# Patient Record
Sex: Female | Born: 1945
Health system: Southern US, Community
[De-identification: ages and names within clinical notes are randomized; demographics above are authoritative.]

## PROBLEM LIST (undated history)

## (undated) DIAGNOSIS — E785 Hyperlipidemia, unspecified: Secondary | ICD-10-CM

## (undated) DIAGNOSIS — C801 Malignant (primary) neoplasm, unspecified: Secondary | ICD-10-CM

## (undated) DIAGNOSIS — Z87442 Personal history of urinary calculi: Secondary | ICD-10-CM

## (undated) DIAGNOSIS — K219 Gastro-esophageal reflux disease without esophagitis: Secondary | ICD-10-CM

## (undated) HISTORY — PX: BASAL CELL CARCINOMA EXCISION: SHX1214

## (undated) HISTORY — DX: Gastro-esophageal reflux disease without esophagitis: K21.9

## (undated) HISTORY — DX: Hyperlipidemia, unspecified: E78.5

---

## 2003-11-04 ENCOUNTER — Other Ambulatory Visit: Admission: RE | Admit: 2003-11-04 | Discharge: 2003-11-04 | Payer: Self-pay | Admitting: Gynecology

## 2010-07-14 ENCOUNTER — Encounter: Payer: Self-pay | Admitting: Family Medicine

## 2010-07-14 ENCOUNTER — Ambulatory Visit
Admission: RE | Admit: 2010-07-14 | Discharge: 2010-07-14 | Payer: Self-pay | Source: Home / Self Care | Attending: Family Medicine | Admitting: Family Medicine

## 2010-07-14 DIAGNOSIS — K219 Gastro-esophageal reflux disease without esophagitis: Secondary | ICD-10-CM | POA: Insufficient documentation

## 2010-07-18 ENCOUNTER — Ambulatory Visit
Admission: RE | Admit: 2010-07-18 | Discharge: 2010-07-18 | Payer: Self-pay | Source: Home / Self Care | Attending: Family Medicine | Admitting: Family Medicine

## 2010-07-18 ENCOUNTER — Encounter: Payer: Self-pay | Admitting: Family Medicine

## 2010-07-18 ENCOUNTER — Other Ambulatory Visit: Payer: Self-pay | Admitting: Family Medicine

## 2010-07-18 LAB — CONVERTED CEMR LAB
Bilirubin Urine: NEGATIVE
Glucose, Urine, Semiquant: NEGATIVE
Ketones, urine, test strip: NEGATIVE
Nitrite: NEGATIVE
Protein, U semiquant: NEGATIVE
Specific Gravity, Urine: <1.005
Urobilinogen, UA: 0.2
WBC Urine, dipstick: NEGATIVE
pH: 7.5

## 2010-07-18 LAB — HEPATIC FUNCTION PANEL
ALT: 20 U/L (ref 0–35)
AST: 20 U/L (ref 0–37)
Albumin: 3.8 g/dL (ref 3.5–5.2)
Alkaline Phosphatase: 63 U/L (ref 39–117)
Bilirubin, Direct: 0.1 mg/dL (ref 0.0–0.3)
Total Bilirubin: 1.2 mg/dL (ref 0.3–1.2)
Total Protein: 6.5 g/dL (ref 6.0–8.3)

## 2010-07-18 LAB — CBC WITH DIFFERENTIAL/PLATELET
Basophils Absolute: 0 10*3/uL (ref 0.0–0.1)
Basophils Relative: 0.5 % (ref 0.0–3.0)
Eosinophils Absolute: 0.2 10*3/uL (ref 0.0–0.7)
Eosinophils Relative: 3.1 % (ref 0.0–5.0)
HCT: 38.6 % (ref 36.0–46.0)
Hemoglobin: 13.1 g/dL (ref 12.0–15.0)
Lymphocytes Relative: 26.2 % (ref 12.0–46.0)
Lymphs Abs: 1.5 10*3/uL (ref 0.7–4.0)
MCHC: 34 g/dL (ref 30.0–36.0)
MCV: 91.5 fl (ref 78.0–100.0)
Monocytes Absolute: 0.6 10*3/uL (ref 0.1–1.0)
Monocytes Relative: 10.2 % (ref 3.0–12.0)
Neutro Abs: 3.5 10*3/uL (ref 1.4–7.7)
Neutrophils Relative %: 60 % (ref 43.0–77.0)
Platelets: 208 10*3/uL (ref 150.0–400.0)
RBC: 4.22 Mil/uL (ref 3.87–5.11)
RDW: 13.1 % (ref 11.5–14.6)
WBC: 5.8 10*3/uL (ref 4.5–10.5)

## 2010-07-18 LAB — BASIC METABOLIC PANEL
BUN: 16 mg/dL (ref 6–23)
CO2: 33 mEq/L — ABNORMAL HIGH (ref 19–32)
Calcium: 9.4 mg/dL (ref 8.4–10.5)
Chloride: 106 mEq/L (ref 96–112)
Creatinine, Ser: 0.7 mg/dL (ref 0.4–1.2)
GFR: 86.59 mL/min (ref >60.00)
Glucose, Bld: 87 mg/dL (ref 70–99)
Potassium: 4.4 mEq/L (ref 3.5–5.1)
Sodium: 143 mEq/L (ref 135–145)

## 2010-07-18 LAB — LIPID PANEL
Cholesterol: 270 mg/dL — ABNORMAL HIGH (ref 0–200)
HDL: 60.9 mg/dL (ref >39.00)
Total CHOL/HDL Ratio: 4
Triglycerides: 93 mg/dL (ref 0.0–149.0)
VLDL: 18.6 mg/dL (ref 0.0–40.0)

## 2010-07-18 LAB — TSH: TSH: 2.74 u[IU]/mL (ref 0.35–5.50)

## 2010-07-18 LAB — LDL CHOLESTEROL, DIRECT: Direct LDL: 194.7 mg/dL

## 2010-07-19 ENCOUNTER — Encounter: Payer: Self-pay | Admitting: Gastroenterology

## 2010-08-07 ENCOUNTER — Encounter (INDEPENDENT_AMBULATORY_CARE_PROVIDER_SITE_OTHER): Payer: Self-pay | Admitting: *Deleted

## 2010-08-10 NOTE — Assessment & Plan Note (Addendum)
Summary: CPX//PH   Vital Signs:  Patient profile:   65 year old female Menstrual status:  postmenopausal Height:      61.5 inches Weight:      184.4 pounds BMI:     34.40 Pulse rate:   84 / minute Pulse rhythm:   regular BP sitting:   118 / 80  (left arm) Cuff size:   large  Vitals Entered By: Almeta Monas CMA Duncan Dull) (July 14, 2010 10:21 AM) CC: New ESt---cpx---not fasting     Menstrual Status postmenopausal   CC:  New ESt---cpx---not fasting.  History of Present Illness: Pt here to establish and have cpe...  Pt will see gyn for pap and breast exam.   Pt then c/o increasing gerd symptoms and the feeling that food gets stuck in her throat. She takes tums every day.  If she does not or eats something spicy or fatty she has symptoms.    Preventive Screening-Counseling & Management  Alcohol-Tobacco     Alcohol drinks/day: 0     Smoking Status: never  Caffeine-Diet-Exercise     Caffeine use/day: 2     Does Patient Exercise: no     Exercise Counseling: to improve exercise regimen  Hep-HIV-STD-Contraception     Dental Visit-last 6 months yes     Dental Care Counseling: not indicated; dental care within six months     SBE monthly: no     SBE Education/Counseling: to perform regular SBE      Sexual History:  currently monogamous.        Drug Use:  no.    Problems Prior to Update: 1)  Preventive Health Care  (ICD-V70.0) 2)  Genella Rife  (ICD-530.81)  Medications Prior to Update: 1)  None  Current Medications (verified): 1)  Multivitamins  Tabs (Multiple Vitamin) .... By Mouth Once Daily 2)  Coq10 30 Mg Caps (Coenzyme Q10) .... By Mouth Once Daily 3)  Probiotic  Caps (Probiotic Product) .... By Mouth Once Daily 4)  Omega 3 340 Mg Cpdr (Omega-3 Fatty Acids) .... By Mouth Once Daily 5)  Omeprazole 20 Mg Cpdr (Omeprazole) .Marland Kitchen.. 1 By Mouth Once Daily  Allergies (verified): No Known Drug Allergies  Past History:  Family History: Last updated: 07/14/2010 Family  History of Arthritis Family History High cholesterol Family History Ovarian cancer  Social History: Last updated: 07/14/2010 Retired Married Never Smoked Alcohol use-no Drug use-no Regular exercise-no  Risk Factors: Alcohol Use: 0 (07/14/2010) Caffeine Use: 2 (07/14/2010) Exercise: no (07/14/2010)  Risk Factors: Smoking Status: never (07/14/2010)  Past Medical History: GERD  Past Surgical History: Denies surgical history  Family History: Reviewed history and no changes required. Family History of Arthritis Family History High cholesterol Family History Ovarian cancer  Social History: Reviewed history and no changes required. Retired Married Never Smoked Alcohol use-no Drug use-no Regular exercise-no Smoking Status:  never Drug Use:  no Does Patient Exercise:  no Caffeine use/day:  2 Dental Care w/in 6 mos.:  yes Sexual History:  currently monogamous  Review of Systems      See HPI General:  Denies chills, fatigue, fever, loss of appetite, malaise, sleep disorder, sweats, weakness, and weight loss. Eyes:  Denies blurring, discharge, double vision, eye irritation, eye pain, halos, itching, light sensitivity, red eye, vision loss-1 eye, and vision loss-both eyes; optho-- q1y-- + contacts. ENT:  Denies decreased hearing, difficulty swallowing, ear discharge, earache, hoarseness, nasal congestion, nosebleeds, postnasal drainage, ringing in ears, sinus pressure, and sore throat. CV:  Denies bluish discoloration  of lips or nails, chest pain or discomfort, difficulty breathing at night, difficulty breathing while lying down, fainting, fatigue, leg cramps with exertion, lightheadness, near fainting, palpitations, shortness of breath with exertion, swelling of feet, swelling of hands, and weight gain. Resp:  Denies chest discomfort, chest pain with inspiration, cough, coughing up blood, excessive snoring, hypersomnolence, morning headaches, pleuritic, shortness of breath,  sputum productive, and wheezing. GI:  Complains of indigestion; denies abdominal pain, bloody stools, change in bowel habits, constipation, dark tarry stools, diarrhea, excessive appetite, gas, hemorrhoids, loss of appetite, nausea, vomiting, vomiting blood, and yellowish skin color. GU:  Denies abnormal vaginal bleeding, decreased libido, discharge, dysuria, genital sores, hematuria, incontinence, nocturia, urinary frequency, and urinary hesitancy. MS:  Denies joint pain, joint redness, joint swelling, loss of strength, low back pain, mid back pain, muscle aches, muscle , cramps, muscle weakness, stiffness, and thoracic pain. Derm:  Denies changes in color of skin, changes in nail beds, dryness, excessive perspiration, flushing, hair loss, insect bite(s), itching, lesion(s), poor wound healing, and rash; derm --  Dr Danella Deis . Neuro:  Denies brief paralysis, difficulty with concentration, disturbances in coordination, falling down, headaches, inability to speak, memory loss, numbness, poor balance, seizures, sensation of room spinning, tingling, tremors, visual disturbances, and weakness. Psych:  Denies alternate hallucination ( auditory/visual), anxiety, depression, easily angered, easily tearful, irritability, mental problems, panic attacks, sense of great danger, suicidal thoughts/plans, thoughts of violence, unusual visions or sounds, and thoughts /plans of harming others. Endo:  Denies cold intolerance, excessive hunger, excessive thirst, excessive urination, heat intolerance, polyuria, and weight change. Heme:  Denies abnormal bruising, bleeding, enlarge lymph nodes, fevers, pallor, and skin discoloration. Allergy:  Denies hives or rash, itching eyes, persistent infections, seasonal allergies, and sneezing.  Physical Exam  General:  Well-developed,well-nourished,in no acute distress; alert,appropriate and cooperative throughout examination Head:  Normocephalic and atraumatic without obvious  abnormalities. No apparent alopecia or balding. Eyes:  pupils equal, pupils round, pupils reactive to light, and no injection.   Ears:  External ear exam shows no significant lesions or deformities.  Otoscopic examination reveals clear canals, tympanic membranes are intact bilaterally without bulging, retraction, inflammation or discharge. Hearing is grossly normal bilaterally. Nose:  External nasal examination shows no deformity or inflammation. Nasal mucosa are pink and moist without lesions or exudates. Mouth:  Oral mucosa and oropharynx without lesions or exudates.  Teeth in good repair. Neck:  No deformities, masses, or tenderness noted.no carotid bruits.   Chest Wall:  No deformities, masses, or tenderness noted. Breasts:  gyn Lungs:  Normal respiratory effort, chest expands symmetrically. Lungs are clear to auscultation, no crackles or wheezes. Heart:  normal rate and no murmur.   Abdomen:  Bowel sounds positive,abdomen soft and non-tender without masses, organomegaly or hernias noted. Rectal:  gyn Genitalia:  gyn Msk:  normal ROM, no joint tenderness, no joint swelling, no joint warmth, no redness over joints, no joint deformities, no joint instability, and no crepitation.   Pulses:  R posterior tibial normal, R dorsalis pedis normal, R carotid normal, L posterior tibial normal, L dorsalis pedis normal, and L carotid normal.   Extremities:  No clubbing, cyanosis, edema, or deformity noted with normal full range of motion of all joints.   Neurologic:  No cranial nerve deficits noted. Station and gait are normal. Plantar reflexes are down-going bilaterally. DTRs are symmetrical throughout. Sensory, motor and coordinative functions appear intact. Skin:  Intact without suspicious lesions or rashes Cervical Nodes:  No lymphadenopathy noted Axillary Nodes:  No palpable lymphadenopathy Psych:  Cognition and judgment appear intact. Alert and cooperative with normal attention span and  concentration. No apparent delusions, illusions, hallucinations   Impression & Recommendations:  Problem # 1:  PREVENTIVE HEALTH CARE (ICD-V70.0) get fasting labs get colon, mammo and pap tetanus given today Orders: Gastroenterology Referral (GI) EKG w/ Interpretation (93000)  Problem # 2:  GERD (ICD-530.81)  Her updated medication list for this problem includes:    Omeprazole 20 Mg Cpdr (Omeprazole) .Marland Kitchen... 1 by mouth once daily  Orders: Gastroenterology Referral (GI) EKG w/ Interpretation (93000)  Diagnostics Reviewed:  Discussed lifestyle modifications, diet, antacids/medications, and preventive measures. Handout provided.   Complete Medication List: 1)  Multivitamins Tabs (Multiple vitamin) .... By mouth once daily 2)  Coq10 30 Mg Caps (Coenzyme q10) .... By mouth once daily 3)  Probiotic Caps (Probiotic product) .... By mouth once daily 4)  Omega 3 340 Mg Cpdr (Omega-3 fatty acids) .... By mouth once daily 5)  Omeprazole 20 Mg Cpdr (Omeprazole) .Marland Kitchen.. 1 by mouth once daily  Other Orders: Tdap => 56yrs IM (04540) Admin 1st Vaccine (98119)  Patient Instructions: 1)  schedule your mammogram and bone density at the breast Center 2)  fasting labs  V70.0  cbcd, bmp, hep, lipid, TSH,  UA ,  varicella titre  3)  schedule your Pap smear with a gyn  Prescriptions: OMEPRAZOLE 20 MG CPDR (OMEPRAZOLE) 1 by mouth once daily  #30 x 5   Entered and Authorized by:   Loreen Freud DO   Signed by:   Loreen Freud DO on 07/14/2010   Method used:   Electronically to        [facility] (retail)       [address]       [place], [place]  [address]       Ph: [phone]       Fax: [phone]   RxID:   [id number]    Orders Added: 1)  Gastroenterology Referral [GI] 2)  Gastroenterology Referral [GI] 3)  Tdap => 59yrs IM [90715] 4)  Admin 1st Vaccine [90471] 5)  New Patient 40-64 years [99386] 6)  EKG w/ Interpretation [93000]   Immunizations  Administered:  Tetanus Vaccine:    Vaccine Type: Tdap    Site: right deltoid    Mfr: Merck    Dose: 0.5 ml    Route: IM    Given by: Almeta Monas CMA (AAMA)    Exp. Date: 04/27/2012    Lot #: [id number]    VIS given: 05/26/08 version given July 14, 2010.   Immunizations Administered:  Tetanus Vaccine:    Vaccine Type: Tdap    Site: right deltoid    Mfr: Merck    Dose: 0.5 ml    Route: IM    Given by: Almeta Monas CMA (AAMA)    Exp. Date: 04/27/2012    Lot #: [id number]    VIS given: 05/26/08 version given July 14, 2010.  Flu Vaccine Next Due:  Refused

## 2010-08-10 NOTE — Letter (Addendum)
Summary: New Patient letter  [facility]  [address] [place], [place] [address]   Phone: [phone]  Fax: [phone]       07/19/2010 MRN: [id number]  [facility] [address] [place], [place]  [address]  Dear Ms. Silvestro,  Welcome to the Gastroenterology Division at [facility].    You are scheduled to see Dr.  Jarold Motto    on 08-24-10 at 9am on the 3rd floor at [facility], [address]  We ask that you try to arrive at our office 15 minutes prior to your appointment time to allow for check-in.  We would like you to complete the enclosed self-administered evaluation form prior to your visit and bring it with you on the day of your appointment.  We will review it with you.  Also, please bring a complete list of all your medications or, if you prefer, bring the medication bottles and we will list them.  Please bring your insurance card so that we may make a copy of it.  If your insurance requires a referral to see a specialist, please bring your referral form from your primary care physician.  Co-payments are due at the time of your visit and may be paid by cash, check or credit card.     Your office visit will consist of a consult with your physician (includes a physical exam), any laboratory testing he/she may order, scheduling of any necessary diagnostic testing (e.g. x-ray, ultrasound, CT-scan), and scheduling of a procedure (e.g. Endoscopy, Colonoscopy) if required.  Please allow enough time on your schedule to allow for any/all of these possibilities.    If you cannot keep your appointment, please call [phone] to cancel or reschedule prior to your appointment date.  This allows [place] the opportunity to schedule an appointment for another patient in need of care.  If you do not cancel or reschedule by 5 p.m. the business day prior to your appointment date, you will be charged a $50.00 late cancellation/no-show fee.    Thank you for  choosing [facility] for your medical needs.  We appreciate the opportunity to care for you.  Please visit [place] at our website  to learn more about our practice.                     Sincerely,                                                             The Gastroenterology Division

## 2010-08-16 NOTE — Letter (Signed)
Summary: New Patient letter  [facility]  [address]   [place], [place] [address]   Phone: [phone]  Fax: [phone]       08/07/2010 MRN: [id number]  [facility] [address] [place], [place]  [address]  Dear Ms. Lawrie,  Welcome to the Gastroenterology Division at [facility].    You are scheduled to see Dr.  Juanda Chance on 08/28/2010 at 2:45 on the 3rd floor at [facility], [address].  We ask that you try to arrive at our office 15 minutes prior to your appointment time to allow for check-in.  We would like you to complete the enclosed self-administered evaluation form prior to your visit and bring it with you on the day of your appointment.  We will review it with you.  Also, please bring a complete list of all your medications or, if you prefer, bring the medication bottles and we will list them.  Please bring your insurance card so that we may make a copy of it.  If your insurance requires a referral to see a specialist, please bring your referral form from your primary care physician.  Co-payments are due at the time of your visit and may be paid by cash, check or credit card.     Your office visit will consist of a consult with your physician (includes a physical exam), any laboratory testing he/she may order, scheduling of any necessary diagnostic testing (e.g. x-ray, ultrasound, CT-scan), and scheduling of a procedure (e.g. Endoscopy, Colonoscopy) if required.  Please allow enough time on your schedule to allow for any/all of these possibilities.    If you cannot keep your appointment, please call [phone] to cancel or reschedule prior to your appointment date.  This allows [place] the opportunity to schedule an appointment for another patient in need of care.  If you do not cancel or reschedule by 5 p.m. the business day prior to your appointment date, you will be charged a $50.00 late cancellation/no-show fee.    Thank you for  choosing [facility] for your medical needs.  We appreciate the opportunity to care for you.  Please visit [place] at our website  to learn more about our practice.                     Sincerely,                                                             The Gastroenterology Division

## 2010-08-28 ENCOUNTER — Ambulatory Visit (INDEPENDENT_AMBULATORY_CARE_PROVIDER_SITE_OTHER): Payer: BC Managed Care – PPO | Admitting: Internal Medicine

## 2010-08-28 ENCOUNTER — Encounter: Payer: Self-pay | Admitting: Internal Medicine

## 2010-08-28 DIAGNOSIS — Z1211 Encounter for screening for malignant neoplasm of colon: Secondary | ICD-10-CM

## 2010-08-28 DIAGNOSIS — K219 Gastro-esophageal reflux disease without esophagitis: Secondary | ICD-10-CM

## 2010-08-28 DIAGNOSIS — R1319 Other dysphagia: Secondary | ICD-10-CM

## 2010-09-05 NOTE — Letter (Signed)
Summary: [facility] Instructions  [facility]  [address] [place], [place] [address]   Phone: [phone]  Fax: [phone]       Dorothy Stone    07/14/1954    MRN: [id number]        Procedure Day /Date: Thursday 09/28/10     Arrival Time: 12:30 pm     Procedure Time: 1:30 pm     Location of Procedure:                    _x _  [facility] (4th Floor)  PREPARATION FOR COLONOSCOPY WITH MOVIPREP   Starting 5 days prior to your procedure 09/23/10 do not eat nuts, seeds, popcorn, corn, beans, peas,  salads, or any raw vegetables.  Do not take any fiber supplements (e.g. Metamucil, Citrucel, and Benefiber).  THE DAY BEFORE YOUR PROCEDURE         DATE: 09/27/10  DAY: Wednesday  1.  Drink clear liquids the entire day-NO SOLID FOOD  2.  Do not drink anything colored red or purple.  Avoid juices with pulp.  No orange juice.  3.  Drink at least 64 oz. (8 glasses) of fluid/clear liquids during the day to prevent dehydration and help the prep work efficiently.  CLEAR LIQUIDS INCLUDE: Water Jello Ice Popsicles Tea (sugar ok, no milk/cream) Powdered fruit flavored drinks Coffee (sugar ok, no milk/cream) Gatorade Juice: apple, white grape, white cranberry  Lemonade Clear bullion, consomm, broth Carbonated beverages (any kind) Strained chicken noodle soup Hard Candy                             4.  In the morning, mix first dose of MoviPrep solution:    Empty 1 Pouch A and 1 Pouch B into the disposable container    Add lukewarm drinking water to the top line of the container. Mix to dissolve    Refrigerate (mixed solution should be used within 24 hrs)  5.  Begin drinking the prep at 5:00 p.m. The MoviPrep container is divided by 4 marks.   Every 15 minutes drink the solution down to the next mark (approximately 8 oz) until the full liter is complete.   6.  Follow completed prep with 16 oz of clear liquid of your choice (Nothing red or purple).   Continue to drink clear liquids until bedtime.  7.  Before going to bed, mix second dose of MoviPrep solution:    Empty 1 Pouch A and 1 Pouch B into the disposable container    Add lukewarm drinking water to the top line of the container. Mix to dissolve    Refrigerate  THE DAY OF YOUR PROCEDURE      DATE: 09/28/10 DAY: Thursday  Beginning at 8:30 a.m. (5 hours before procedure):         1. Every 15 minutes, drink the solution down to the next mark (approx 8 oz) until the full liter is complete.  2. Follow completed prep with 16 oz. of clear liquid of your choice.    3. You may drink clear liquids until 11:30 am (2 HOURS BEFORE PROCEDURE).   MEDICATION INSTRUCTIONS  Unless otherwise instructed, you should take regular prescription medications with a small sip of water   as early as possible the morning of your procedure.        OTHER INSTRUCTIONS  You will need a responsible adult at least 65 years  of age to accompany you and drive you home.   This person must remain in the waiting room during your procedure.  Wear loose fitting clothing that is easily removed.  Leave jewelry and other valuables at home.  However, you may wish to bring a book to read or  an iPod/MP3 player to listen to music as you wait for your procedure to start.  Remove all body piercing jewelry and leave at home.  Total time from sign-in until discharge is approximately 2-3 hours.  You should go home directly after your procedure and rest.  You can resume normal activities the  day after your procedure.  The day of your procedure you should not:   Drive   Make legal decisions   Operate machinery   Drink alcohol   Return to work  You will receive specific instructions about eating, activities and medications before you leave.    The above instructions have been reviewed and explained to me by   _______________________    I fully understand and can verbalize these instructions  _____________________________ Date _________

## 2010-09-05 NOTE — Assessment & Plan Note (Signed)
Summary: GERD..JJ.sch w/dr [facility] office  [employer]//CX POL ADIVSED   History of Present Illness Visit Type: Initial Consult Primary GI MD: Lina Sar MD Primary Provider: Loreen Freud, DO Requesting Provider: Loreen Freud, DO Chief Complaint: Increase in acid reflux, Consult colon. Pt states she had a colonoscopy over 10 years ago with Dr. Scotty Court that is now retired. Pt thinks it is time for her next colonoscopy. History of Present Illness:   This is a 65 year old white female with a long history of gastroesophageal reflux dating back to her last pregnancy 40 years ago. She was taking Tums, up to 6 a day for many years and in the last several weeks has started on Prilosec 20 mg daily which has completely relieved her symptoms of reflux. She has had several episodes of dysphagia to solids. She had a flexible sigmoidoscopy about 30 years ago but has never had a complete colonoscopy. Her reflux was in the past managed by decreasing her food intake and avoiding fatty foods. Her father had an esophageal stricture and her mother had a hiatal hernia. Her weight has increased steadily.   GI Review of Systems    Reports acid reflux, bloating, and  dysphagia with solids.      Denies abdominal pain, belching, chest pain, dysphagia with liquids, heartburn, loss of appetite, nausea, vomiting, vomiting blood, weight loss, and  weight gain.        Denies anal fissure, black tarry stools, change in bowel habit, constipation, diarrhea, diverticulosis, fecal incontinence, heme positive stool, hemorrhoids, irritable bowel syndrome, jaundice, light color stool, liver problems, rectal bleeding, and  rectal pain.    Current Medications (verified): 1)  Multivitamins  Tabs (Multiple Vitamin) .... By Mouth Once Daily 2)  Coq10 30 Mg Caps (Coenzyme Q10) .... By Mouth Once Daily 3)  Probiotic  Caps (Probiotic Product) .... By Mouth Once Daily 4)  Omega 3 340 Mg Cpdr (Omega-3 Fatty Acids) .... By Mouth Once Daily 5)   Omeprazole 20 Mg Cpdr (Omeprazole) .Marland Kitchen.. 1 By Mouth Once Daily  Allergies (verified): No Known Drug Allergies  Past History:  Past Medical History: GERD Hyperlipidemia  Past Surgical History: Reviewed history from 07/14/2010 and no changes required. Denies surgical history  Family History: Family History of Arthritis Family History High cholesterol Family History Ovarian cancer Family History of Colon Polyps:Father Family History of Colon Cancer:Paternal Grandmother ?  Social History: Reviewed history from 07/14/2010 and no changes required. Retired Married Never Smoked Alcohol use-no Drug use-no Regular exercise-no  Review of Systems  The patient denies allergy/sinus, anemia, anxiety-new, arthritis/joint pain, back pain, blood in urine, breast changes/lumps, change in vision, confusion, cough, coughing up blood, depression-new, fainting, fatigue, fever, headaches-new, hearing problems, heart murmur, heart rhythm changes, itching, menstrual pain, muscle pains/cramps, night sweats, nosebleeds, pregnancy symptoms, shortness of breath, skin rash, sleeping problems, sore throat, swelling of feet/legs, swollen lymph glands, thirst - excessive , urination - excessive , urination changes/pain, urine leakage, vision changes, and voice change.         Pertinent positive and negative review of systems were noted in the above HPI. All other ROS was otherwise negative.   Vital Signs:  Patient profile:   65 year old female Menstrual status:  postmenopausal Height:      61.5 inches Weight:      187.38 pounds BMI:     34.96 Pulse rate:   88 / minute Pulse rhythm:   regular BP sitting:   134 / 78  (right arm) Cuff size:  regular  Vitals Entered By: Christie Nottingham CMA Duncan Dull) (August 28, 2010 3:16 PM)  Physical Exam  General:  Well developed, well nourished, no acute distress. Eyes:  PERRLA, no icterus. Mouth:  No deformity or lesions, dentition normal. Chest Wall:  no chest  wall tenderness. Lungs:  no wheezes or rales. Heart:  Regular rate and rhythm; no murmurs, rubs,  or bruits. Abdomen:  soft somewhat obese abdomen with normoactive bowel sounds. No scars. No tenderness. Liver edge at costal margin. Msk:  Symmetrical with no gross deformities. Normal posture. Skin:  Intact without significant lesions or rashes. Psych:  Alert and cooperative. Normal mood and affect.   Impression & Recommendations:  Problem # 1:  GERD (ICD-530.81) Patient has long-standing gastroesophageal reflux with occasional solid food dysphagia suggestive of either a hiatal hernia or stricture. She has had complete relief of the symptoms with Prilosec 20 mg daily. I have asked her to start strict antireflux measures and continue her Prilosec. We will proceed with an upper endoscopy and possible dilatation. We will rule out Barrett's esophagus. Orders: Colon/Endo (Colon/Endo)  Problem # 2:  SPECIAL SCREENING FOR MALIGNANT NEOPLASMS COLON (ICD-V76.51) Patient is scheduled for a screening colonoscopy. Orders: Colon/Endo (Colon/Endo)  Patient Instructions: 1)  You have been scheduled for an endoscopy and colonoscopy. Please follow written instructions that were given to you at your office visit today. 2)  Please pick up your prescription for Moviprep at the pharmacy. An electronic presription has already been sent.  3)  Copy sent to : Loreen Freud, DO 4)  The medication list was reviewed and reconciled.  All changed / newly prescribed medications were explained.  A complete medication list was provided to the patient / caregiver. Prescriptions: MOVIPREP 100 GM  SOLR (PEG-KCL-NACL-NASULF-NA ASC-C) As per prep instructions.  #1 x 0   Entered by:   Lamona Curl CMA (AAMA)   Authorized by:   Hart Carwin MD   Signed by:   Lamona Curl CMA (AAMA) on 08/28/2010   Method used:   Electronically to        [facility] (retail)       [address]       [place], [place]  [address]       Ph: [phone]       Fax: [phone]   RxID:   [id number]

## 2010-09-28 ENCOUNTER — Encounter: Payer: BC Managed Care – PPO | Admitting: Internal Medicine

## 2011-05-14 ENCOUNTER — Encounter: Payer: Self-pay | Admitting: Family Medicine

## 2011-05-14 ENCOUNTER — Ambulatory Visit (INDEPENDENT_AMBULATORY_CARE_PROVIDER_SITE_OTHER): Payer: Medicare Other | Admitting: Family Medicine

## 2011-05-14 VITALS — BP 114/70 | HR 108 | Temp 100.2°F | Wt 158.0 lb

## 2011-05-14 DIAGNOSIS — N390 Urinary tract infection, site not specified: Secondary | ICD-10-CM

## 2011-05-14 DIAGNOSIS — R300 Dysuria: Secondary | ICD-10-CM

## 2011-05-14 DIAGNOSIS — N3090 Cystitis, unspecified without hematuria: Secondary | ICD-10-CM

## 2011-05-14 LAB — POCT URINALYSIS DIPSTICK
Spec Grav, UA: 1.01
Urobilinogen, UA: 1
pH, UA: 5

## 2011-05-14 MED ORDER — CIPROFLOXACIN HCL 500 MG PO TABS: 500.0000 mg | ORAL_TABLET | Freq: Two times a day (BID) | ORAL | Status: AC

## 2011-05-14 NOTE — Progress Notes (Signed)
  Subjective:    Dorothy Stone is a 65 y.o. female who complains of burning with urination, frequency, suprapubic pressure and urgency. She has had symptoms for 4 days. Patient also complains of NA. Patient denies back pain, congestion, cough, fever, headache, rhinitis, sorethroat, stomach ache and vaginal discharge. Patient does not have a history of recurrent UTI. Patient does not have a history of pyelonephritis.   The following portions of the patient's history were reviewed and updated as appropriate: allergies, current medications, past family history, past medical history, past social history, past surgical history and problem list.  Review of Systems Pertinent items are noted in HPI.    Objective:    BP 114/70  Pulse 108  Temp(Src) 100.2 F (37.9 C) (Oral)  Wt 158 lb (71.668 kg)  SpO2 97% General appearance: alert, cooperative, appears stated age and no distress Abdomen: soft, non-tender; bowel sounds normal; no masses,  no organomegaly  Laboratory:  Urine dipstick: + for leukocyte esterase, + for nitrites and + for protein.   Micro exam: not done.    Assessment:    Acute cystitis and UTI     Plan:    Medications: ciprofloxacin. Maintain adequate hydration. Follow up if symptoms not improving, and as needed.

## 2011-05-14 NOTE — Patient Instructions (Signed)

## 2011-07-24 ENCOUNTER — Telehealth: Payer: Self-pay | Admitting: *Deleted

## 2011-07-24 NOTE — Telephone Encounter (Signed)
Pt left vm stating she is certain she has a UTI like she did on 05-14-11 when she came in the office, can she have medication called in for her? I tried to call to get more information unable to reach pt at this time will call back again

## 2011-07-24 NOTE — Telephone Encounter (Signed)
Spoke with pt to advise we need a urine sample, set pt up for 1:15pm next office day 07-25-11 for the lab to have urine dip and culture due to painful urination/frequency

## 2011-07-24 NOTE — Telephone Encounter (Signed)
We would need to check a urine and culture it

## 2011-07-25 ENCOUNTER — Other Ambulatory Visit (INDEPENDENT_AMBULATORY_CARE_PROVIDER_SITE_OTHER): Payer: Medicare Other

## 2011-07-25 DIAGNOSIS — N390 Urinary tract infection, site not specified: Secondary | ICD-10-CM

## 2011-07-25 DIAGNOSIS — R319 Hematuria, unspecified: Secondary | ICD-10-CM

## 2011-07-25 LAB — POCT URINALYSIS DIPSTICK
Bilirubin, UA: NEGATIVE
Glucose, UA: NEGATIVE
Ketones, UA: NEGATIVE
Leukocytes, UA: NEGATIVE
Protein, UA: NEGATIVE
Spec Grav, UA: 1.01

## 2011-07-27 LAB — URINE CULTURE
Colony Count: NO GROWTH
Organism ID, Bacteria: NO GROWTH

## 2011-10-01 ENCOUNTER — Ambulatory Visit (INDEPENDENT_AMBULATORY_CARE_PROVIDER_SITE_OTHER): Payer: Medicare Other | Admitting: Internal Medicine

## 2011-10-01 ENCOUNTER — Encounter: Payer: Self-pay | Admitting: Internal Medicine

## 2011-10-01 VITALS — BP 130/78 | HR 72 | Temp 98.6°F | Wt 158.2 lb

## 2011-10-01 DIAGNOSIS — H1130 Conjunctival hemorrhage, unspecified eye: Secondary | ICD-10-CM

## 2011-10-01 DIAGNOSIS — R51 Headache: Secondary | ICD-10-CM

## 2011-10-01 LAB — CBC WITH DIFFERENTIAL/PLATELET
Basophils Relative: 0.6 % (ref 0.0–3.0)
Eosinophils Relative: 2.1 % (ref 0.0–5.0)
HCT: 38.5 % (ref 36.0–46.0)
Lymphs Abs: 1.9 10*3/uL (ref 0.7–4.0)
MCV: 92.9 fl (ref 78.0–100.0)
Monocytes Absolute: 0.5 10*3/uL (ref 0.1–1.0)
Neutro Abs: 2.8 10*3/uL (ref 1.4–7.7)
Platelets: 195 10*3/uL (ref 150.0–400.0)
WBC: 5.4 10*3/uL (ref 4.5–10.5)

## 2011-10-01 LAB — APTT: aPTT: 27 s (ref 21.7–28.8)

## 2011-10-01 NOTE — Progress Notes (Signed)
Subjective:    Patient ID: Dorothy Stone, female    DOB: 1945-10-18, 66 y.o.   MRN: [id number]  HPI She awoke 09/28/11 with a right temporal headache. This resolved without treatment but she noted bleeding in the sclera of the right eye. She denies epistaxis, hemoptysis, melena, rectal bleeding, hematuria, abnormal bruising or bleeding.  She is not on excess aspirin or other anticoagulants. She denies taking supplements such as garlic.  She has had this issue recurrently in the past involving one or the other eye. It has never been to this degree.  She has no past medical history of hypertension.          Review of Systems She denies blurred vision, double vision, or loss of vision. There has been no discharge from the eye.  She denies any significant pain in the neck or shoulder girdle. She may have had  minor right temporal aching  She denies symptoms of an upper respiratory tract infection recently     Objective:   Physical Exam  Gen.: Healthy and well-nourished in appearance. Alert, appropriate and cooperative throughout exam. Head: Normocephalic without obvious abnormalities Eyes: Marked scleral hemorrhage OD, greatest inferiorly  w/o conjunctival inflammation noted. Pupils equal round reactive to light and accommodation. . Vision grossly normal with lenses. Ears: External  ear exam reveals no significant lesions or deformities. Canals clear .TMs normal. Hearing is grossly normal bilaterally. Nose: External nasal exam reveals no deformity or inflammation. Nasal mucosa are pink and moist. No lesions or exudates noted.  Mouth: Oral mucosa and oropharynx reveal no lesions or exudates. Teeth in good repair. Neck: No deformities, masses, or tenderness noted. Supple Lungs: Normal respiratory effort; chest expands symmetrically. Lungs are clear to auscultation without rales, wheezes, or increased work of breathing. Heart: Normal rate and rhythm. Normal S1 and S2. No gallop, click, or rub.  S4 ; no murmur. Abdomen: Bowel sounds normal; abdomen soft and nontender. No masses, organomegaly or hernias noted.                                                                             Musculoskeletal/extremities: No clubbing, cyanosis, edema, or deformity noted. Range of motion  normal .Joints normal. Nail health  good. Vascular: Carotid, radial artery pulses are full and equal. No bruits present. Neurologic: Alert and oriented x3. Deep tendon reflexes symmetrical and normal.          Skin: Intact without suspicious lesions or rashes. Lymph: No cervical, axillary  lymphadenopathy present. Psych: Mood and affect are normal. Normally interactive                                                                                        Assessment & Plan:  #1 marked scleral hemorrhage; this represents a recurrent issue . By history there is no predisposition to this.  Plan: See orders and  recommendations

## 2011-10-01 NOTE — Patient Instructions (Signed)
Use natural tears every 2 hours while awake until seen by Dr. August Luz.Share results with him

## 2014-05-18 ENCOUNTER — Ambulatory Visit: Payer: Medicare Other

## 2014-05-18 ENCOUNTER — Ambulatory Visit (INDEPENDENT_AMBULATORY_CARE_PROVIDER_SITE_OTHER): Payer: Medicare Other | Admitting: Internal Medicine

## 2014-05-18 ENCOUNTER — Ambulatory Visit (INDEPENDENT_AMBULATORY_CARE_PROVIDER_SITE_OTHER): Payer: Medicare Other

## 2014-05-18 VITALS — BP 138/90 | HR 70 | Temp 98.6°F | Resp 16 | Ht 62.5 in | Wt 180.0 lb

## 2014-05-18 DIAGNOSIS — M25562 Pain in left knee: Principal | ICD-10-CM

## 2014-05-18 DIAGNOSIS — L989 Disorder of the skin and subcutaneous tissue, unspecified: Secondary | ICD-10-CM

## 2014-05-18 DIAGNOSIS — M25561 Pain in right knee: Secondary | ICD-10-CM

## 2014-05-18 DIAGNOSIS — M79606 Pain in leg, unspecified: Secondary | ICD-10-CM

## 2014-05-18 MED ORDER — MELOXICAM 7.5 MG PO TABS: 7.5000 mg | ORAL_TABLET | Freq: Every day | ORAL | Status: DC

## 2014-05-18 NOTE — Progress Notes (Deleted)
   Subjective:    Patient ID: Dorothy Stone, female    DOB: 1945/11/11, 68 y.o.   MRN: [id number]  HPI    Review of Systems     Objective:   Physical Exam        Assessment & Plan:

## 2014-05-18 NOTE — Progress Notes (Signed)
Subjective:  This chart was scribed for Dorothy Lin, MD by Donato Schultz, Medical Scribe. This patient was seen in Room 12 and the patient's care was started at 1:13 PM.   Patient ID: Dorothy Stone, female    DOB: December 25, 1945, 68 y.o.   MRN: [id number]  HPI HPI Comments: Dorothy Stone is a 68 y.o. female who presents to the [facility] complaining of intermittent, aching pain in her legs bilaterally with her right leg worse than the left.  She states that she took Ibuprofen for her symptoms yesterday with no relief to her symptoms.  She denies knee pain as an associated symptom but states that she hears popping in her knees when she stands up.  She states that her feet do not feel cold.  She takes probiotics, Coenzyme Q-10, and omega 3 regularly but is not on any chronic medications.  Her mother has a history of arthritis in her knees and ovarian cancer.  She had blood work 2 years ago and states that her cholesterol was mildly elevated.  Her PCP is Dr. Florene Glen.    She is also complaining of a mole on her left leg that has recently become darker in color.     Past Medical History  Diagnosis Date  . GERD (gastroesophageal reflux disease)   . Hyperlipidemia    History reviewed. No pertinent past surgical history. Family History  Problem Relation Age of Onset  . Arthritis    . Hyperlipidemia    . Ovarian cancer    . Colon polyps Father   . Colon cancer Paternal Grandmother    History   Social History  . Marital Status: Married    Spouse Name: N/A    Number of Children: N/A  . Years of Education: N/A   Occupational History  . Not on file.   Social History Main Topics  . Smoking status: Never Smoker   . Smokeless tobacco: Never Used  . Alcohol Use: No  . Drug Use: No  . Sexual Activity: Not on file   Other Topics Concern  . Not on file   Social History Narrative   No Known Allergies    Review of Systems  Cardiovascular: Negative for leg swelling.    Musculoskeletal: Positive for arthralgias. Negative for joint swelling and gait problem.    Objective:  Physical Exam  Constitutional: She is oriented to person, place, and time. She appears well-developed and well-nourished.  HENT:  Head: Normocephalic and atraumatic.  Eyes: EOM are normal.  Neck: Normal range of motion.  Cardiovascular: Normal rate.   Pulmonary/Chest: Effort normal.  Musculoskeletal: Normal range of motion. She exhibits tenderness.  Both knees are slightly swollen without effusion and tender at the joint line although not unstable.  Tenderness in both calves and in the right posterior thigh to palpation without masses or defects.  Positive Homan's on the right.  Good peripheral pulses bilaterally and no loss of sensation.  Both hips have good range of motion.    Neurological: She is alert and oriented to person, place, and time.  Skin: Skin is warm and dry.  Psychiatric: She has a normal mood and affect. Her behavior is normal.  Nursing note and vitals reviewed. mole R leg with irritation/cracking/2 colors  [facility] preliminary x-ray report read by Dr. Laney Pastor: mild degen changes R>L   BP 138/90 mmHg  Pulse 70  Temp(Src) 98.6 F (37 C) (Oral)  Resp 16  Ht 5' 2.5" (1.588 m)  Wt  180 lb (81.647 kg)  BMI 32.38 kg/m2  SpO2 94% Assessment & Plan:  Pain in both knees - Plan: DG Knee 1-2 Views Left, DG Knee 1-2 Views Right  Painful legs,calves, R thigh - Plan: Lower Extremity Venous Duplex Bilateral to r/o clots  Skin lesion - Plan: Ambulatory referral to Dermatology for removal  Meds ordered this encounter  Medications  . meloxicam (MOBIC) 7.5 MG tablet    Sig: Take 1 tablet (7.5 mg total) by mouth daily.    Dispense:  30 tablet    Refill:  0     I personally performed the services described in this documentation, which was scribed in my presence. The recorded information has been reviewed and is accurate.

## 2014-05-19 ENCOUNTER — Encounter (HOSPITAL_COMMUNITY): Payer: Medicare Other

## 2014-05-20 ENCOUNTER — Ambulatory Visit (HOSPITAL_COMMUNITY): Payer: Medicare Other | Attending: Cardiology | Admitting: Cardiology

## 2014-05-20 DIAGNOSIS — M79606 Pain in leg, unspecified: Secondary | ICD-10-CM | POA: Diagnosis not present

## 2014-05-20 DIAGNOSIS — E785 Hyperlipidemia, unspecified: Secondary | ICD-10-CM | POA: Insufficient documentation

## 2014-05-20 DIAGNOSIS — M7989 Other specified soft tissue disorders: Secondary | ICD-10-CM

## 2014-05-20 NOTE — Progress Notes (Signed)
Bilateral lower venous duplex performed  

## 2014-05-22 ENCOUNTER — Telehealth: Payer: Self-pay | Admitting: Radiology

## 2014-05-22 DIAGNOSIS — M25569 Pain in unspecified knee: Secondary | ICD-10-CM

## 2014-05-22 NOTE — Telephone Encounter (Signed)
Patient called to make appt at Dermatologist, its a 6 month wait.  Is this ok? Or does she need to be seen sooner for mole on leg?

## 2014-05-22 NOTE — Telephone Encounter (Signed)
Patient prefers Dorothy Stone and to see a Female orthopedic doctor.

## 2014-05-24 NOTE — Telephone Encounter (Signed)
This needs to be put in as a referral before we can send this to [place]

## 2014-05-24 NOTE — Telephone Encounter (Signed)
Completed referral 

## 2014-05-24 NOTE — Telephone Encounter (Signed)
Better to be seen sooner---[facility]

## 2014-05-25 NOTE — Telephone Encounter (Signed)
Referrals is sending to [facility] dermatology

## 2014-09-09 DIAGNOSIS — L820 Inflamed seborrheic keratosis: Secondary | ICD-10-CM | POA: Diagnosis not present

## 2014-12-01 DIAGNOSIS — L570 Actinic keratosis: Secondary | ICD-10-CM | POA: Diagnosis not present

## 2014-12-01 DIAGNOSIS — D2272 Melanocytic nevi of left lower limb, including hip: Secondary | ICD-10-CM | POA: Diagnosis not present

## 2014-12-01 DIAGNOSIS — L821 Other seborrheic keratosis: Secondary | ICD-10-CM | POA: Diagnosis not present

## 2014-12-01 DIAGNOSIS — B353 Tinea pedis: Secondary | ICD-10-CM | POA: Diagnosis not present

## 2014-12-01 DIAGNOSIS — D2261 Melanocytic nevi of right upper limb, including shoulder: Secondary | ICD-10-CM | POA: Diagnosis not present

## 2014-12-01 DIAGNOSIS — D225 Melanocytic nevi of trunk: Secondary | ICD-10-CM | POA: Diagnosis not present

## 2014-12-01 DIAGNOSIS — C44712 Basal cell carcinoma of skin of right lower limb, including hip: Secondary | ICD-10-CM | POA: Diagnosis not present

## 2014-12-01 DIAGNOSIS — D485 Neoplasm of uncertain behavior of skin: Secondary | ICD-10-CM | POA: Diagnosis not present

## 2014-12-16 DIAGNOSIS — D485 Neoplasm of uncertain behavior of skin: Secondary | ICD-10-CM | POA: Diagnosis not present

## 2014-12-16 DIAGNOSIS — L97129 Non-pressure chronic ulcer of left thigh with unspecified severity: Secondary | ICD-10-CM | POA: Diagnosis not present

## 2015-03-17 DIAGNOSIS — Z85828 Personal history of other malignant neoplasm of skin: Secondary | ICD-10-CM | POA: Diagnosis not present

## 2015-03-17 DIAGNOSIS — L905 Scar conditions and fibrosis of skin: Secondary | ICD-10-CM | POA: Diagnosis not present

## 2015-05-27 DIAGNOSIS — R002 Palpitations: Secondary | ICD-10-CM | POA: Diagnosis not present

## 2015-05-27 DIAGNOSIS — Z0000 Encounter for general adult medical examination without abnormal findings: Secondary | ICD-10-CM | POA: Diagnosis not present

## 2015-05-27 DIAGNOSIS — R000 Tachycardia, unspecified: Secondary | ICD-10-CM | POA: Diagnosis not present

## 2015-05-27 DIAGNOSIS — E785 Hyperlipidemia, unspecified: Secondary | ICD-10-CM | POA: Diagnosis not present

## 2015-06-29 ENCOUNTER — Encounter: Payer: Self-pay | Admitting: Cardiology

## 2015-06-29 DIAGNOSIS — M810 Age-related osteoporosis without current pathological fracture: Secondary | ICD-10-CM | POA: Diagnosis not present

## 2015-07-13 ENCOUNTER — Ambulatory Visit: Payer: Self-pay | Admitting: Cardiology

## 2015-07-14 ENCOUNTER — Ambulatory Visit (INDEPENDENT_AMBULATORY_CARE_PROVIDER_SITE_OTHER): Payer: Medicare Other | Admitting: Cardiology

## 2015-07-14 ENCOUNTER — Encounter: Payer: Self-pay | Admitting: Cardiology

## 2015-07-14 VITALS — BP 120/80 | HR 85 | Ht 62.5 in | Wt 165.8 lb

## 2015-07-14 DIAGNOSIS — R002 Palpitations: Secondary | ICD-10-CM | POA: Diagnosis not present

## 2015-07-14 DIAGNOSIS — R079 Chest pain, unspecified: Secondary | ICD-10-CM

## 2015-07-14 NOTE — Progress Notes (Signed)
Cardiology Office Note   Date:  07/14/2015   ID:  Dorothy Stone, DOB June 21, 1946, MRN [id number]  PCP:  Dorothy Bellows, MD    Chief Complaint  Patient presents with  . Chest Pain  . Palpitations      History of Present Illness: Dorothy Stone is a 70 y.o. female who presents for evaluation of chest pain and palpitations.  The patient has a history of heart fluttering for years lasting seconds.  She had a bad episode this past fall that lasted several minutes and she felt like she was going to pass out. She got very flushed and hot and this occurred while she was standing by a table talking to a friend.  After that she had an ache on the left side of her heart.  She felt very wiped out after the event.   Shortly after that it happened daily for a while. She also gets occasional dull pains. She occasionally will have a flushed feeling and feels very weak and then it dissipates.   These are nonexertional.  She denies any SOB, DOE, PND, orthopnea,syncope.    Her mom has a history of CVA and afib.  Her brother had an MI.  She has never smoked.  Her last LDL was 199.      Past Medical History  Diagnosis Date  . GERD (gastroesophageal reflux disease)   . Hyperlipidemia     Past Surgical History  Procedure Laterality Date  . Basal cell carcinoma excision       Current Outpatient Prescriptions  Medication Sig Dispense Refill  . co-enzyme Q-10 30 MG capsule Take 60 mg by mouth daily.      . Multiple Vitamin (MULTIVITAMIN) tablet Take 1 tablet by mouth daily.      Marland Kitchen omega-3 acid ethyl esters (LOVAZA) 1 G capsule Take 2 g by mouth daily.      . Probiotic Product (PROBIOTIC FORMULA) CAPS Take 2 capsules by mouth daily.       No current facility-administered medications for this visit.    Allergies:   Review of patient's allergies indicates no known allergies.    Social History:  The patient  reports that she has never smoked. She has never used smokeless  tobacco. She reports that she does not drink alcohol or use illicit drugs.   Family History:  The patient's family history includes CAD in her brother and mother; Colon cancer in her paternal grandmother; Colon polyps in her father; GI problems in her mother; Heart block in her brother; Hyperlipidemia in her father; Ovarian cancer in her mother; Stroke in her mother.    ROS:  Please see the history of present illness.   Otherwise, review of systems are positive for none.   All other systems are reviewed and negative.    PHYSICAL EXAM: VS:  BP 120/80 mmHg  Pulse 85  Ht 5' 2.5" (1.588 m)  Wt 165 lb 12.8 oz (75.206 kg)  BMI 29.82 kg/m2 , BMI Body mass index is 29.82 kg/(m^2). GEN: Well nourished, well developed, in no acute distress HEENT: normal Neck: no JVD, carotid bruits, or masses Cardiac: RRR; no murmurs, rubs, or gallops,no edema  Respiratory:  clear to auscultation bilaterally, normal work of breathing GI: soft, nontender, nondistended, + BS MS: no deformity or atrophy Skin: warm and dry, no rash Neuro:  Strength and sensation are intact Psych: euthymic mood,  full affect   EKG:  EKG is ordered today. The ekg ordered today demonstrates NSR at 85bpm with no ST changes   Recent Labs: No results found for requested labs within last 365 days.    Lipid Panel    Component Value Date/Time   CHOL 270* 07/18/2010 0925   TRIG 93.0 07/18/2010 0925   HDL 60.90 07/18/2010 0925   CHOLHDL 4 07/18/2010 0925   VLDL 18.6 07/18/2010 0925   LDLDIRECT 194.7 07/18/2010 0925      Wt Readings from Last 3 Encounters:  07/14/15 165 lb 12.8 oz (75.206 kg)  05/18/14 180 lb (81.647 kg)  10/01/11 158 lb 3.2 oz (71.759 kg)      ASSESSMENT AND PLAN:  1.  Palpitations - these have been very infrequent for years but now more intense episodes associated with dizziness and weakness.  I will get a 30 day event monitor to assess for arrhythmias. 2.  Chest pain that is somewhat atypical in that  it is nonexertional and has no associated symptoms except weakness.  She does not smoke but has a family history of CAD (brother had an MI in his 48's but was a smoker).  I will get an ETT myoview to rule out ischemia.   3.  Dyslipidemia followed by PCP   Current medicines are reviewed at length with the patient today.  The patient does not have concerns regarding medicines.  The following changes have been made:  no change  Labs/ tests ordered today: See above Assessment and Plan No orders of the defined types were placed in this encounter.     Disposition:   FU with me PRN pending results of studies  Signed, Sueanne Margarita, MD  07/14/2015 2:12 PM    [facility] [address], [place], [place]  [address] Phone: [phone]; Fax: [phone]

## 2015-07-14 NOTE — Patient Instructions (Signed)
Medication Instructions:  Your physician recommends that you continue on your current medications as directed. Please refer to the Current Medication list given to you today.   Labwork: None  Testing/Procedures: Dr. Radford Pax recommends you have a NUCLEAR STRESS TEST.  Your physician has recommended that you wear an event monitor. Event monitors are medical devices that record the heart's electrical activity. Doctors most often [place] these monitors to diagnose arrhythmias. Arrhythmias are problems with the speed or rhythm of the heartbeat. The monitor is a small, portable device. You can wear one while you do your normal daily activities. This is usually used to diagnose what is causing palpitations/syncope (passing out).  Follow-Up: Your physician recommends that you schedule a follow-up appointment AS NEEDED with Dr. Radford Pax pending your study results.   Any Other Special Instructions Will Be Listed Below (If Applicable).     If you need a refill on your cardiac medications before your next appointment, please call your pharmacy.

## 2015-07-18 ENCOUNTER — Telehealth (HOSPITAL_COMMUNITY): Payer: Self-pay | Admitting: *Deleted

## 2015-07-18 NOTE — Telephone Encounter (Signed)
Patient given detailed instructions per Myocardial Perfusion Study Information Sheet for the test on 07/20/15 at 7:45. Patient notified to arrive 15 minutes early and that it is imperative to arrive on time for appointment to keep from having the test rescheduled.  If you need to cancel or reschedule your appointment, please call the office within 24 hours of your appointment. Failure to do so may result in a cancellation of your appointment, and a $50 no show fee. Patient verbalized understanding.Dorothy Stone

## 2015-07-19 NOTE — Addendum Note (Signed)
Addended by: Fransico Him R on: 07/19/2015 10:01 PM   Modules accepted: Level of Service

## 2015-07-20 ENCOUNTER — Ambulatory Visit (HOSPITAL_COMMUNITY): Payer: Medicare Other | Attending: Cardiovascular Disease

## 2015-07-20 ENCOUNTER — Ambulatory Visit (INDEPENDENT_AMBULATORY_CARE_PROVIDER_SITE_OTHER): Payer: Medicare Other

## 2015-07-20 DIAGNOSIS — R002 Palpitations: Secondary | ICD-10-CM | POA: Insufficient documentation

## 2015-07-20 DIAGNOSIS — R079 Chest pain, unspecified: Secondary | ICD-10-CM | POA: Insufficient documentation

## 2015-07-20 LAB — MYOCARDIAL PERFUSION IMAGING
CHL CUP MPHR: 151 {beats}/min
CSEPHR: 95 %
CSEPPHR: 144 {beats}/min
Estimated workload: 8.5 METS
Exercise duration (min): 7 min
Exercise duration (sec): 0 s
LHR: 0.37
LV dias vol: 68 mL
LV sys vol: 15 mL
RPE: 18
Rest HR: 62 {beats}/min
SDS: 6
SRS: 5
SSS: 11
TID: 0.74

## 2015-07-20 MED ORDER — TECHNETIUM TC 99M SESTAMIBI GENERIC - CARDIOLITE
10.6000 | Freq: Once | INTRAVENOUS | Status: AC | PRN
  Administered 2015-07-20: 11 via INTRAVENOUS

## 2015-07-20 MED ORDER — TECHNETIUM TC 99M SESTAMIBI GENERIC - CARDIOLITE
32.8000 | Freq: Once | INTRAVENOUS | Status: AC | PRN
  Administered 2015-07-20: 32.8 via INTRAVENOUS

## 2015-07-21 ENCOUNTER — Ambulatory Visit: Payer: Self-pay | Admitting: Cardiology

## 2015-08-19 ENCOUNTER — Telehealth: Payer: Self-pay | Admitting: Cardiology

## 2015-08-19 NOTE — Telephone Encounter (Signed)
Mrs. Nishihara is calling about her echo results and today was the last day for he monitor .

## 2015-08-19 NOTE — Telephone Encounter (Signed)
Informed patient she did not have an ECHO done, but a stress test and that was normal. Patient st she just put her monitor in the mail and she wants the results. Informed patient that the monitor has to be received by the company then the results will be sent to Dr. Radford Pax for review. Informed her it would be mid next week she will have her results. Patient grateful for call.

## 2016-06-12 ENCOUNTER — Other Ambulatory Visit: Payer: Self-pay | Admitting: Family Medicine

## 2016-06-12 DIAGNOSIS — N63 Unspecified lump in unspecified breast: Secondary | ICD-10-CM

## 2016-06-14 ENCOUNTER — Other Ambulatory Visit: Payer: Medicare Other

## 2016-06-21 ENCOUNTER — Ambulatory Visit
Admission: RE | Admit: 2016-06-21 | Discharge: 2016-06-21 | Disposition: A | Discharge status: Home / Self Care | Payer: Medicare Other | Source: Ambulatory Visit | Attending: Family Medicine | Admitting: Family Medicine

## 2016-06-21 DIAGNOSIS — N63 Unspecified lump in unspecified breast: Secondary | ICD-10-CM

## 2016-07-09 HISTORY — PX: BASAL CELL CARCINOMA EXCISION: SHX1214

## 2017-04-08 DIAGNOSIS — N3001 Acute cystitis with hematuria: Secondary | ICD-10-CM | POA: Diagnosis not present

## 2017-04-08 DIAGNOSIS — R350 Frequency of micturition: Secondary | ICD-10-CM | POA: Diagnosis not present

## 2017-04-08 DIAGNOSIS — Z1211 Encounter for screening for malignant neoplasm of colon: Secondary | ICD-10-CM | POA: Diagnosis not present

## 2017-07-09 HISTORY — PX: EYE SURGERY: SHX253

## 2017-07-10 DIAGNOSIS — E785 Hyperlipidemia, unspecified: Secondary | ICD-10-CM | POA: Diagnosis not present

## 2017-07-10 DIAGNOSIS — Z1211 Encounter for screening for malignant neoplasm of colon: Secondary | ICD-10-CM | POA: Diagnosis not present

## 2017-07-10 DIAGNOSIS — M810 Age-related osteoporosis without current pathological fracture: Secondary | ICD-10-CM | POA: Diagnosis not present

## 2017-07-10 DIAGNOSIS — Z0000 Encounter for general adult medical examination without abnormal findings: Secondary | ICD-10-CM | POA: Diagnosis not present

## 2017-07-30 DIAGNOSIS — H04123 Dry eye syndrome of bilateral lacrimal glands: Secondary | ICD-10-CM | POA: Diagnosis not present

## 2017-07-30 DIAGNOSIS — H353121 Nonexudative age-related macular degeneration, left eye, early dry stage: Secondary | ICD-10-CM | POA: Diagnosis not present

## 2017-07-30 DIAGNOSIS — H25813 Combined forms of age-related cataract, bilateral: Secondary | ICD-10-CM | POA: Diagnosis not present

## 2017-09-09 DIAGNOSIS — J209 Acute bronchitis, unspecified: Secondary | ICD-10-CM | POA: Diagnosis not present

## 2017-11-29 DIAGNOSIS — L570 Actinic keratosis: Secondary | ICD-10-CM | POA: Diagnosis not present

## 2017-11-29 DIAGNOSIS — D224 Melanocytic nevi of scalp and neck: Secondary | ICD-10-CM | POA: Diagnosis not present

## 2017-11-29 DIAGNOSIS — L814 Other melanin hyperpigmentation: Secondary | ICD-10-CM | POA: Diagnosis not present

## 2017-11-29 DIAGNOSIS — D2271 Melanocytic nevi of right lower limb, including hip: Secondary | ICD-10-CM | POA: Diagnosis not present

## 2017-11-29 DIAGNOSIS — D2239 Melanocytic nevi of other parts of face: Secondary | ICD-10-CM | POA: Diagnosis not present

## 2018-01-30 DIAGNOSIS — H04123 Dry eye syndrome of bilateral lacrimal glands: Secondary | ICD-10-CM | POA: Diagnosis not present

## 2018-01-30 DIAGNOSIS — H353121 Nonexudative age-related macular degeneration, left eye, early dry stage: Secondary | ICD-10-CM | POA: Diagnosis not present

## 2018-01-30 DIAGNOSIS — H25813 Combined forms of age-related cataract, bilateral: Secondary | ICD-10-CM | POA: Diagnosis not present

## 2018-01-30 DIAGNOSIS — H5203 Hypermetropia, bilateral: Secondary | ICD-10-CM | POA: Diagnosis not present

## 2018-07-24 DIAGNOSIS — H353121 Nonexudative age-related macular degeneration, left eye, early dry stage: Secondary | ICD-10-CM | POA: Diagnosis not present

## 2018-07-24 DIAGNOSIS — H04123 Dry eye syndrome of bilateral lacrimal glands: Secondary | ICD-10-CM | POA: Diagnosis not present

## 2018-07-24 DIAGNOSIS — H25813 Combined forms of age-related cataract, bilateral: Secondary | ICD-10-CM | POA: Diagnosis not present

## 2018-08-18 DIAGNOSIS — H2512 Age-related nuclear cataract, left eye: Secondary | ICD-10-CM | POA: Diagnosis not present

## 2018-08-25 DIAGNOSIS — H2512 Age-related nuclear cataract, left eye: Secondary | ICD-10-CM | POA: Diagnosis not present

## 2018-08-25 DIAGNOSIS — H25812 Combined forms of age-related cataract, left eye: Secondary | ICD-10-CM | POA: Diagnosis not present

## 2018-09-02 DIAGNOSIS — H2511 Age-related nuclear cataract, right eye: Secondary | ICD-10-CM | POA: Diagnosis not present

## 2018-09-10 DIAGNOSIS — M810 Age-related osteoporosis without current pathological fracture: Secondary | ICD-10-CM | POA: Diagnosis not present

## 2018-09-10 DIAGNOSIS — E785 Hyperlipidemia, unspecified: Secondary | ICD-10-CM | POA: Diagnosis not present

## 2018-09-10 DIAGNOSIS — Z0000 Encounter for general adult medical examination without abnormal findings: Secondary | ICD-10-CM | POA: Diagnosis not present

## 2018-09-12 ENCOUNTER — Other Ambulatory Visit: Payer: Self-pay | Admitting: Family Medicine

## 2018-09-12 DIAGNOSIS — E2839 Other primary ovarian failure: Secondary | ICD-10-CM

## 2018-09-15 DIAGNOSIS — H2511 Age-related nuclear cataract, right eye: Secondary | ICD-10-CM | POA: Diagnosis not present

## 2018-09-15 DIAGNOSIS — H25811 Combined forms of age-related cataract, right eye: Secondary | ICD-10-CM | POA: Diagnosis not present

## 2018-12-26 DIAGNOSIS — D2271 Melanocytic nevi of right lower limb, including hip: Secondary | ICD-10-CM | POA: Diagnosis not present

## 2018-12-26 DIAGNOSIS — D2272 Melanocytic nevi of left lower limb, including hip: Secondary | ICD-10-CM | POA: Diagnosis not present

## 2018-12-26 DIAGNOSIS — L821 Other seborrheic keratosis: Secondary | ICD-10-CM | POA: Diagnosis not present

## 2018-12-26 DIAGNOSIS — D1801 Hemangioma of skin and subcutaneous tissue: Secondary | ICD-10-CM | POA: Diagnosis not present

## 2018-12-26 DIAGNOSIS — D225 Melanocytic nevi of trunk: Secondary | ICD-10-CM | POA: Diagnosis not present

## 2018-12-26 DIAGNOSIS — L570 Actinic keratosis: Secondary | ICD-10-CM | POA: Diagnosis not present

## 2019-06-11 ENCOUNTER — Other Ambulatory Visit: Payer: Self-pay | Admitting: Internal Medicine

## 2019-06-11 DIAGNOSIS — Z1231 Encounter for screening mammogram for malignant neoplasm of breast: Secondary | ICD-10-CM

## 2019-06-24 DIAGNOSIS — H04123 Dry eye syndrome of bilateral lacrimal glands: Secondary | ICD-10-CM | POA: Diagnosis not present

## 2019-06-24 DIAGNOSIS — H353121 Nonexudative age-related macular degeneration, left eye, early dry stage: Secondary | ICD-10-CM | POA: Diagnosis not present

## 2019-06-24 DIAGNOSIS — Z961 Presence of intraocular lens: Secondary | ICD-10-CM | POA: Diagnosis not present

## 2020-01-18 DIAGNOSIS — H00011 Hordeolum externum right upper eyelid: Secondary | ICD-10-CM | POA: Diagnosis not present

## 2020-01-18 DIAGNOSIS — H00031 Abscess of right upper eyelid: Secondary | ICD-10-CM | POA: Diagnosis not present

## 2020-04-13 DIAGNOSIS — H04123 Dry eye syndrome of bilateral lacrimal glands: Secondary | ICD-10-CM | POA: Diagnosis not present

## 2020-04-13 DIAGNOSIS — H353121 Nonexudative age-related macular degeneration, left eye, early dry stage: Secondary | ICD-10-CM | POA: Diagnosis not present

## 2020-04-13 DIAGNOSIS — Z961 Presence of intraocular lens: Secondary | ICD-10-CM | POA: Diagnosis not present

## 2020-04-13 DIAGNOSIS — H26492 Other secondary cataract, left eye: Secondary | ICD-10-CM | POA: Diagnosis not present

## 2020-09-30 ENCOUNTER — Emergency Department (HOSPITAL_COMMUNITY)
Admission: EM | Admit: 2020-09-30 | Discharge: 2020-09-30 | Disposition: A | Discharge status: Home / Self Care | Payer: Medicare Other | Attending: Emergency Medicine | Admitting: Emergency Medicine

## 2020-09-30 ENCOUNTER — Encounter (HOSPITAL_COMMUNITY): Payer: Self-pay

## 2020-09-30 ENCOUNTER — Emergency Department (HOSPITAL_COMMUNITY): Payer: Medicare Other

## 2020-09-30 ENCOUNTER — Other Ambulatory Visit: Payer: Self-pay

## 2020-09-30 DIAGNOSIS — Z859 Personal history of malignant neoplasm, unspecified: Secondary | ICD-10-CM | POA: Diagnosis not present

## 2020-09-30 DIAGNOSIS — K219 Gastro-esophageal reflux disease without esophagitis: Secondary | ICD-10-CM | POA: Diagnosis not present

## 2020-09-30 DIAGNOSIS — K439 Ventral hernia without obstruction or gangrene: Secondary | ICD-10-CM | POA: Diagnosis not present

## 2020-09-30 DIAGNOSIS — N132 Hydronephrosis with renal and ureteral calculous obstruction: Secondary | ICD-10-CM | POA: Diagnosis not present

## 2020-09-30 DIAGNOSIS — R1031 Right lower quadrant pain: Secondary | ICD-10-CM | POA: Diagnosis present

## 2020-09-30 DIAGNOSIS — K4020 Bilateral inguinal hernia, without obstruction or gangrene, not specified as recurrent: Secondary | ICD-10-CM | POA: Diagnosis not present

## 2020-09-30 DIAGNOSIS — R109 Unspecified abdominal pain: Secondary | ICD-10-CM | POA: Diagnosis not present

## 2020-09-30 DIAGNOSIS — N23 Unspecified renal colic: Secondary | ICD-10-CM | POA: Diagnosis not present

## 2020-09-30 DIAGNOSIS — I10 Essential (primary) hypertension: Secondary | ICD-10-CM | POA: Insufficient documentation

## 2020-09-30 DIAGNOSIS — K449 Diaphragmatic hernia without obstruction or gangrene: Secondary | ICD-10-CM | POA: Diagnosis not present

## 2020-09-30 HISTORY — DX: Malignant (primary) neoplasm, unspecified: C80.1

## 2020-09-30 LAB — URINALYSIS, ROUTINE W REFLEX MICROSCOPIC
Bacteria, UA: NONE SEEN
Bilirubin Urine: NEGATIVE
Glucose, UA: NEGATIVE mg/dL
Ketones, ur: NEGATIVE mg/dL
Leukocytes,Ua: NEGATIVE
Nitrite: NEGATIVE
Protein, ur: NEGATIVE mg/dL
Specific Gravity, Urine: 1.01 (ref 1.005–1.030)
pH: 6 (ref 5.0–8.0)

## 2020-09-30 LAB — CBC WITH DIFFERENTIAL/PLATELET
Abs Immature Granulocytes: 0.02 10*3/uL (ref 0.00–0.07)
Basophils Absolute: 0.1 10*3/uL (ref 0.0–0.1)
Basophils Relative: 1 %
Eosinophils Absolute: 0.2 10*3/uL (ref 0.0–0.5)
Eosinophils Relative: 2 %
HCT: 42 % (ref 36.0–46.0)
Hemoglobin: 13.4 g/dL (ref 12.0–15.0)
Immature Granulocytes: 0 %
Lymphocytes Relative: 28 %
Lymphs Abs: 2.5 10*3/uL (ref 0.7–4.0)
MCH: 30 pg (ref 26.0–34.0)
MCHC: 31.9 g/dL (ref 30.0–36.0)
MCV: 94.2 fL (ref 80.0–100.0)
Monocytes Absolute: 0.8 10*3/uL (ref 0.1–1.0)
Monocytes Relative: 9 %
Neutro Abs: 5.3 10*3/uL (ref 1.7–7.7)
Neutrophils Relative %: 60 %
Platelets: 254 10*3/uL (ref 150–400)
RBC: 4.46 MIL/uL (ref 3.87–5.11)
RDW: 13.2 % (ref 11.5–15.5)
WBC: 8.9 10*3/uL (ref 4.0–10.5)
nRBC: 0 % (ref 0.0–0.2)

## 2020-09-30 LAB — BASIC METABOLIC PANEL
Anion gap: 7 (ref 5–15)
BUN: 19 mg/dL (ref 8–23)
CO2: 26 mmol/L (ref 22–32)
Calcium: 9.5 mg/dL (ref 8.9–10.3)
Chloride: 107 mmol/L (ref 98–111)
Creatinine, Ser: 0.77 mg/dL (ref 0.44–1.00)
GFR, Estimated: >60 mL/min (ref >60)
Glucose, Bld: 94 mg/dL (ref 70–99)
Potassium: 3.7 mmol/L (ref 3.5–5.1)
Sodium: 140 mmol/L (ref 135–145)

## 2020-09-30 MED ORDER — ONDANSETRON HCL 4 MG/2ML IJ SOLN
4.0000 mg | Freq: Once | INTRAMUSCULAR | Status: AC
  Administered 2020-09-30: 4 mg via INTRAVENOUS
  Filled 2020-09-30: qty 2

## 2020-09-30 MED ORDER — SODIUM CHLORIDE 0.9 % IV BOLUS
1000.0000 mL | Freq: Once | INTRAVENOUS | Status: AC
  Administered 2020-09-30: 1000 mL via INTRAVENOUS

## 2020-09-30 MED ORDER — MORPHINE SULFATE (PF) 4 MG/ML IV SOLN
4.0000 mg | Freq: Once | INTRAVENOUS | Status: AC
  Administered 2020-09-30: 4 mg via INTRAVENOUS
  Filled 2020-09-30: qty 1

## 2020-09-30 NOTE — Discharge Instructions (Addendum)
You were seen today for flank pain.  Your CT scan suggest a recently passed stone.  You also have a stone in your kidney.  Your ureter is dilated and shows some inflammation.  Follow-up routinely with urology recommended.  Make sure that you are staying hydrated.

## 2020-09-30 NOTE — ED Provider Notes (Signed)
[place] DEPT Provider Note   CSN: [id number] Arrival date & time: 09/30/20  1654     History Chief Complaint  Patient presents with  . Flank Pain    Dorothy Stone is a 75 y.o. female.  HPI     This is a 75 year old female with a history of reflux and hypertension who presents with right flank pain.  Patient reports that over the last year she has had some intermittent right lower quadrant pain that radiates into the right flank.  Often times she can get it to improve with Tylenol.  However over the last several days she has had worsening pain and tonight it was worse than normal.  She currently rates her pain at 5 out of 10.  She has had some associated nausea.  No dysuria or hematuria.  No known history of kidney stones.  She denies any fevers.  Past Medical History:  Diagnosis Date  . Cancer ([place])   . GERD (gastroesophageal reflux disease)   . Hyperlipidemia     Patient Active Problem List   Diagnosis Date Noted  . Chest pain 07/14/2015  . Palpitations 07/14/2015  . GERD 07/14/2010    Past Surgical History:  Procedure Laterality Date  . BASAL CELL CARCINOMA EXCISION       OB History   No obstetric history on file.     Family History  Problem Relation Age of Onset  . Arthritis Other   . Hyperlipidemia Father   . Ovarian cancer Mother   . Colon polyps Father   . Colon cancer Paternal Grandmother   . Stroke Mother   . GI problems Mother   . CAD Mother   . Heart block Brother   . CAD Brother     Social History   Tobacco Use  . Smoking status: Never Smoker  . Smokeless tobacco: Never Used  Vaping Use  . Vaping Use: Never used  Substance Use Topics  . Alcohol use: No  . Drug use: No    Home Medications Prior to Admission medications   Medication Sig Start Date End Date Taking? Authorizing Provider  Probiotic Product (PROBIOTIC FORMULA) CAPS Take 2 capsules by mouth daily.   Yes [provider]     Allergies    Patient has no known allergies.  Review of Systems   Review of Systems  Constitutional: Negative for fever.  Respiratory: Negative for shortness of breath.   Cardiovascular: Negative for chest pain.  Gastrointestinal: Negative for abdominal pain, nausea and vomiting.  Genitourinary: Positive for flank pain. Negative for dysuria and hematuria.  All other systems reviewed and are negative.   Physical Exam Updated Vital Signs BP 118/68   Pulse 67   Temp 98.6 F (37 C) (Oral)   Resp 16   Ht 1.575 m (5\' 2" )   Wt 71.7 kg   SpO2 100%   BMI 28.90 kg/m   Physical Exam Vitals and nursing note reviewed.  Constitutional:      Appearance: She is well-developed. She is obese. She is not ill-appearing.  HENT:     Head: Normocephalic and atraumatic.     Mouth/Throat:     Mouth: Mucous membranes are moist.  Eyes:     Pupils: Pupils are equal, round, and reactive to light.  Cardiovascular:     Rate and Rhythm: Normal rate and regular rhythm.     Heart sounds: Normal heart sounds.  Pulmonary:     Effort: Pulmonary effort is normal.  No respiratory distress.     Breath sounds: No wheezing.  Abdominal:     Palpations: Abdomen is soft.     Tenderness: There is no abdominal tenderness. There is no right CVA tenderness or left CVA tenderness.  Musculoskeletal:     Cervical back: Neck supple.  Skin:    General: Skin is warm and dry.  Neurological:     Mental Status: She is alert and oriented to person, place, and time.  Psychiatric:        Mood and Affect: Mood normal.     ED Results / Procedures / Treatments   Labs (all labs ordered are listed, but only abnormal results are displayed) Labs Reviewed  URINALYSIS, ROUTINE W REFLEX MICROSCOPIC - Abnormal; Notable for the following components:      Result Value   Color, Urine STRAW (*)    Hgb urine dipstick SMALL (*)    All other components within normal limits  BASIC METABOLIC PANEL  CBC WITH  DIFFERENTIAL/PLATELET    EKG None  Radiology CT Renal Stone Study  Result Date: 09/30/2020 CLINICAL DATA:  Right-sided abdominal pain.  Possible kidney stone. EXAM: CT ABDOMEN AND PELVIS WITHOUT CONTRAST TECHNIQUE: Multidetector CT imaging of the abdomen and pelvis was performed following the standard protocol without IV contrast. COMPARISON:  None. FINDINGS: Lower chest: Large hiatal hernia with the majority of the stomach being intrathoracic. No abnormal gastric distension or inflammation. Adjacent compressive atelectasis in the left lower lobe. No pleural fluid. Hepatobiliary: No focal liver abnormality is seen. No gallstones, gallbladder wall thickening, or biliary dilatation. Pancreas: No ductal dilatation or inflammation. Spleen: Normal in size without focal abnormality. Adrenals/Urinary Tract: Minimal left adrenal thickening. No dominant adrenal nodule. Moderate right hydroureteronephrosis. The ureter is dilated to the distal ureter just proximal to the bladder insertion. No ureteral calculi or cause for obstruction. There is mild right perinephric edema. Ureteral thickening and stranding is noted in the proximal ureter just beyond the ureteropelvic junction. There is a 9 mm nonobstructing stone in the lower right kidney. Multiple right retroperitoneal calcifications appear to represent phleboliths. No left hydronephrosis or stone. Tiny partially exophytic cyst from the posterior left kidney. Left ureter is decompressed. Urinary bladder is minimally distended. No bladder stone. No bladder wall thickening. No urethral stone is seen. Stomach/Bowel: Large hiatal hernia with the majority of the stomach being intrathoracic. There is occasional fecalization of small bowel contents. No obstruction or wall thickening. Normal appendix is tentatively visualized. Colonic diverticulosis is prominent from the splenic flexure distally. No evidence of diverticulitis. There is significant colonic redundancy.  Vascular/Lymphatic: Moderate aorto bi-iliac atherosclerosis. No aortic aneurysm. Small retroperitoneal lymph nodes are not enlarged by size criteria. There is no pelvic adenopathy. Reproductive: Unremarkable uterus.  No adnexal mass. Other: Fat containing bilateral inguinal hernias. There is a fat containing left lower quadrant ventral abdominal wall hernia. No free fluid or ascites. No Musculoskeletal: Degenerative disc disease at L5-S1 with Modic endplate changes. Degenerative change of the pubic symphysis. There is lower lumbar facet hypertrophy. No focal bone lesion or acute osseous abnormality. IMPRESSION: 1. Moderate right hydroureteronephrosis. The ureter is dilated to the distal ureter just proximal to the bladder insertion. No ureteral calculi or cause for obstruction. 2. There is ureteral thickening and fat stranding in the proximal right ureter just beyond the ureteropelvic junction. This is nonspecific, may be related to recently passed stone. Direct visualization with urography may be helpful to exclude possibility of urothelial lesion. 3. Nonobstructing 9 mm stone in  the lower right kidney. 4. Large hiatal hernia with the majority of the stomach being intrathoracic. 5. Colonic diverticulosis without diverticulitis. 6. Fat containing bilateral inguinal and left lower quadrant ventral abdominal wall hernias. Aortic Atherosclerosis (ICD10-I70.0). Electronically Signed   By: Keith Rake M.D.   On: 09/30/2020 21:18    Procedures Procedures   Medications Ordered in ED Medications  sodium chloride 0.9 % bolus 1,000 mL (1,000 mLs Intravenous New Bag/Given 09/30/20 2050)  morphine 4 MG/ML injection 4 mg (4 mg Intravenous Given 09/30/20 2050)  ondansetron (ZOFRAN) injection 4 mg (4 mg Intravenous Given 09/30/20 2050)    ED Course  I have reviewed the triage vital signs and the nursing notes.  Pertinent labs & imaging results that were available during my care of the patient were reviewed by me  and considered in my medical decision making (see chart for details).    MDM Rules/Calculators/A&P                          Patient presents with right-sided flank pain.  Reports she has had intermittent symptoms for over a year that have resolved with Tylenol previously.  However she has had worsening symptoms over the last several days.  She is nontoxic and vital signs are reassuring.  Her history and clinical presentation is most consistent with renal colic.  She has no known history of kidney stones.  Other considerations include UTI, less likely appendicitis.  Labs ordered.  Patient was given fluids, pain and nausea medication.  CT scan shows right-sided hydroureter as well as some thickening which is most consistent with a recently passed stone.  She also has a 9 mm stone in her right kidney.  She also has multiple incidental findings including abdominal hernias that do not appear incarcerated.  On recheck, she states she feels much better.  I discussed with her that I felt that she likely had recently passed a stone.  Her urine is reassuring without evidence of infection.  Her labs are largely reassuring as well.  Recommend routine urology follow-up.  She was advised that she has a fairly large stone in her right kidney that is not currently the culprit but were she to have recurrent pain, this could be.  After history, exam, and medical workup I feel the patient has been appropriately medically screened and is safe for discharge home. Pertinent diagnoses were discussed with the patient. Patient was given return precautions.  Final Clinical Impression(s) / ED Diagnoses Final diagnoses:  Flank pain  Renal colic    Rx / DC Orders ED Discharge Orders    None       Merryl Hacker, MD 09/30/20 2232

## 2020-09-30 NOTE — ED Triage Notes (Signed)
Patient c/o right flank pain and nausea today. Patient denies any hematuria, frequency, or dysuria.

## 2020-10-10 DIAGNOSIS — N130 Hydronephrosis with ureteropelvic junction obstruction: Secondary | ICD-10-CM | POA: Diagnosis not present

## 2020-10-10 DIAGNOSIS — R1084 Generalized abdominal pain: Secondary | ICD-10-CM | POA: Diagnosis not present

## 2020-10-10 DIAGNOSIS — N200 Calculus of kidney: Secondary | ICD-10-CM | POA: Diagnosis not present

## 2020-11-07 DIAGNOSIS — R1084 Generalized abdominal pain: Secondary | ICD-10-CM | POA: Diagnosis not present

## 2020-11-07 DIAGNOSIS — N2889 Other specified disorders of kidney and ureter: Secondary | ICD-10-CM | POA: Diagnosis not present

## 2020-11-07 DIAGNOSIS — N200 Calculus of kidney: Secondary | ICD-10-CM | POA: Diagnosis not present

## 2020-11-07 DIAGNOSIS — N281 Cyst of kidney, acquired: Secondary | ICD-10-CM | POA: Diagnosis not present

## 2020-11-07 DIAGNOSIS — K449 Diaphragmatic hernia without obstruction or gangrene: Secondary | ICD-10-CM | POA: Diagnosis not present

## 2020-11-25 DIAGNOSIS — R1084 Generalized abdominal pain: Secondary | ICD-10-CM | POA: Diagnosis not present

## 2020-11-25 DIAGNOSIS — N200 Calculus of kidney: Secondary | ICD-10-CM | POA: Diagnosis not present

## 2020-11-25 DIAGNOSIS — N130 Hydronephrosis with ureteropelvic junction obstruction: Secondary | ICD-10-CM | POA: Diagnosis not present

## 2020-11-25 DIAGNOSIS — N281 Cyst of kidney, acquired: Secondary | ICD-10-CM | POA: Diagnosis not present

## 2020-11-28 ENCOUNTER — Other Ambulatory Visit: Payer: Self-pay | Admitting: Urology

## 2020-12-12 DIAGNOSIS — H353111 Nonexudative age-related macular degeneration, right eye, early dry stage: Secondary | ICD-10-CM | POA: Diagnosis not present

## 2020-12-12 DIAGNOSIS — H52223 Regular astigmatism, bilateral: Secondary | ICD-10-CM | POA: Diagnosis not present

## 2020-12-12 DIAGNOSIS — H26492 Other secondary cataract, left eye: Secondary | ICD-10-CM | POA: Diagnosis not present

## 2020-12-12 DIAGNOSIS — H04123 Dry eye syndrome of bilateral lacrimal glands: Secondary | ICD-10-CM | POA: Diagnosis not present

## 2020-12-20 ENCOUNTER — Other Ambulatory Visit: Payer: Self-pay

## 2020-12-20 ENCOUNTER — Encounter (HOSPITAL_BASED_OUTPATIENT_CLINIC_OR_DEPARTMENT_OTHER): Payer: Self-pay | Admitting: Urology

## 2020-12-20 NOTE — Progress Notes (Addendum)
Spoke w/ via phone for pre-op interview---pt Lab needs dos----   none            Lab results------see below COVID test -----patient states asymptomatic no test needed Arrive at -------830 am 12-27-2020 NPO after MN NO Solid Food.  Clear liquids from MN until---730 am then npo Med rec completed Medications to take morninn/ag of surgery -----none Diabetic medication ----- Patient instructed no nail polish to be worn day of surgery Patient instructed to bring photo id and insurance card day of surgery Patient aware to have Driver (ride ) / caregiver  husband Dorothy Stone will stay Pre-Op special Istructions -----n/a Patient verbalized understanding of instructions that were given at this phone interview. Patient denies shortness of breath, chest pain, fever, cough at this phone interview.   Worked up by dr Tressia Miners [facility] 07-14-2015 epic f/u prn Heart monitor report 07-26-2015 epic Stress test 07-26-2015 epic

## 2020-12-26 NOTE — H&P (Signed)
CC/HPI: cc: hydronephrosis, flank pain   10/11/20: 75 year old woman with 4 month history of right intermittent flank pain. She went to ED and CT abd/pelvis showed right hydronephrosis to level of bladder without obstructing stone. Patient says pain comes and goes. No fever, chills, or emesis. She denies gross hematuria or hx of kidney stones. Tylenol usually helps pain.   11/25/20: 75 year old woman with several month history of right intermittent flank pain returns for follow-up after CT urogram. CT scan shows enhancement of right proximal ureter as well as an 8 mm lower pole nonobstructing calculus. Patient continues to have pain approximately 1 time a week. She denies any gross hematuria. She has also experienced some left-sided flank pain however no abnormality noted on the left side.     ALLERGIES: None   MEDICATIONS: Co Q10  Olive Leaf Extract  Omega-3 + D  Probiotic  Vitamin C  Zinc     GU PSH: Locm 300-399Mg /Ml Iodine,1Ml - 11/07/2020     NON-GU PSH: None   GU PMH: Flank Pain - 11/07/2020, - 10/10/2020 Hydronephrosis - 10/10/2020 Renal calculus - 10/10/2020    NON-GU PMH: Arthritis GERD Hypercholesterolemia    FAMILY HISTORY: None   SOCIAL HISTORY: Marital Status: Married Preferred Language: English; Ethnicity: Not Hispanic Or Latino; Race: White Current Smoking Status: Patient has never smoked.   Tobacco Use Assessment Completed: Used Tobacco in last 30 days? Has never drank.  Drinks 2 caffeinated drinks per day.    REVIEW OF SYSTEMS:    GU Review Female:   Patient denies frequent urination, hard to postpone urination, burning /pain with urination, get up at night to urinate, leakage of urine, stream starts and stops, trouble starting your stream, have to strain to urinate, and being pregnant.  Gastrointestinal (Upper):   Patient denies nausea, vomiting, and indigestion/ heartburn.  Gastrointestinal (Lower):   Patient denies diarrhea and constipation.  Constitutional:    Patient denies fever, night sweats, weight loss, and fatigue.  Skin:   Patient denies itching and skin rash/ lesion.  Eyes:   Patient denies blurred vision and double vision.  Ears/ Nose/ Throat:   Patient denies sore throat and sinus problems.  Hematologic/Lymphatic:   Patient denies swollen glands and easy bruising.  Cardiovascular:   Patient denies leg swelling and chest pains.  Respiratory:   Patient denies cough and shortness of breath.  Endocrine:   Patient denies excessive thirst.  Musculoskeletal:   Patient denies back pain and joint pain.  Neurological:   Patient denies headaches and dizziness.  Psychologic:   Patient denies depression and anxiety.   VITAL SIGNS: None   MULTI-SYSTEM PHYSICAL EXAMINATION:    Constitutional: Well-nourished. No physical deformities. Normally developed. Good grooming.  Neck: Neck symmetrical, not swollen. Normal tracheal position.  Respiratory: No labored breathing, no use of accessory muscles.   Skin: No paleness, no jaundice, no cyanosis. No lesion, no ulcer, no rash.  Neurologic / Psychiatric: Oriented to time, oriented to place, oriented to person. No depression, no anxiety, no agitation.  Gastrointestinal: No rigidity, non obese abdomen.   Eyes: Normal conjunctivae. Normal eyelids.  Ears, Nose, Mouth, and Throat: Left ear no scars, no lesions, no masses. Right ear no scars, no lesions, no masses. Nose no scars, no lesions, no masses. Normal hearing. Normal lips.  Musculoskeletal: Normal gait and station of head and neck.     Complexity of Data:  Records Review:   Previous Patient Records, POC Tool  Urine Test Review:   Urinalysis  X-Ray  Review: C.T. Abdomen/Pelvis: Reviewed Films. Reviewed Report. Discussed With Patient. Patient was given a printed radiology copy of her CT scan.    Notes:                     CT 11/08/20  IMPRESSION:  Stable urothelial thickening with enhancement involving the right  proximal collecting system/ureter,  possibly post inflammatory,  although attention on follow-up is suggested. No associated  urothelial filling defect or stricture.   8 mm nonobstructing right lower pole renal calculus, unchanged. No  ureteral or bladder calculi. No hydronephrosis.   13 mm posterior interpolar left renal cyst, benign (Bosniak I). No  enhancing renal lesions.   PROCEDURES:          Urinalysis - 81003 Dipstick Dipstick Cont'd  Color: Yellow Bilirubin: Neg  Appearance: Clear Ketones: Neg  Specific Gravity: 1.010 Blood: Neg  pH: 6.0 Protein: Neg  Glucose: Neg Urobilinogen: 0.2    Nitrites: Neg    Leukocyte Esterase: Neg    Notes:      ASSESSMENT:      ICD-10 Details  1 GU:   Hydronephrosis - N13.0 Undiagnosed New Problem  2   Renal calculus - N20.0 Chronic, Stable  3   Renal cyst - N28.1 Chronic, Stable  4   Flank Pain - R10.84 Chronic, Stable   PLAN:           Document Letter(s):  Created for Patient: Clinical Summary         Notes:   I gave patient printed copy of her CT scan radiology report. Given findings and persistent symptoms would like to proceed with diagnostic ureteroscopy, laser lithotripsy of renal calculus, ureteral washings, possible biopsy and stent placement. Risks and benefits of the above procedure were discussed the patient detail and she has elected to proceed. She will be scheduled for next available surgery date.

## 2020-12-27 ENCOUNTER — Encounter (HOSPITAL_BASED_OUTPATIENT_CLINIC_OR_DEPARTMENT_OTHER): Payer: Self-pay | Admitting: Urology

## 2020-12-27 ENCOUNTER — Other Ambulatory Visit: Payer: Self-pay

## 2020-12-27 ENCOUNTER — Ambulatory Visit (HOSPITAL_BASED_OUTPATIENT_CLINIC_OR_DEPARTMENT_OTHER)
Admission: RE | Admit: 2020-12-27 | Discharge: 2020-12-27 | Disposition: A | Discharge status: Home / Self Care | Payer: Medicare Other | Attending: Urology | Admitting: Urology

## 2020-12-27 ENCOUNTER — Ambulatory Visit (HOSPITAL_BASED_OUTPATIENT_CLINIC_OR_DEPARTMENT_OTHER): Payer: Medicare Other | Admitting: Anesthesiology

## 2020-12-27 ENCOUNTER — Encounter (HOSPITAL_BASED_OUTPATIENT_CLINIC_OR_DEPARTMENT_OTHER)
Admission: RE | Disposition: A | Discharge status: Home / Self Care | Payer: Self-pay | Source: Home / Self Care | Attending: Urology

## 2020-12-27 DIAGNOSIS — R896 Abnormal cytological findings in specimens from other organs, systems and tissues: Secondary | ICD-10-CM | POA: Diagnosis not present

## 2020-12-27 DIAGNOSIS — R310 Gross hematuria: Secondary | ICD-10-CM | POA: Insufficient documentation

## 2020-12-27 DIAGNOSIS — N200 Calculus of kidney: Secondary | ICD-10-CM | POA: Diagnosis not present

## 2020-12-27 DIAGNOSIS — N281 Cyst of kidney, acquired: Secondary | ICD-10-CM | POA: Insufficient documentation

## 2020-12-27 DIAGNOSIS — E785 Hyperlipidemia, unspecified: Secondary | ICD-10-CM | POA: Diagnosis not present

## 2020-12-27 DIAGNOSIS — N132 Hydronephrosis with renal and ureteral calculous obstruction: Secondary | ICD-10-CM | POA: Insufficient documentation

## 2020-12-27 DIAGNOSIS — K219 Gastro-esophageal reflux disease without esophagitis: Secondary | ICD-10-CM | POA: Diagnosis not present

## 2020-12-27 DIAGNOSIS — N2889 Other specified disorders of kidney and ureter: Secondary | ICD-10-CM | POA: Diagnosis not present

## 2020-12-27 HISTORY — DX: Personal history of urinary calculi: Z87.442

## 2020-12-27 HISTORY — PX: CYSTOSCOPY/URETEROSCOPY/HOLMIUM LASER/STENT PLACEMENT: SHX6546

## 2020-12-27 SURGERY — CYSTOSCOPY/URETEROSCOPY/HOLMIUM LASER/STENT PLACEMENT
Anesthesia: General | Site: Ureter | Laterality: Right

## 2020-12-27 MED ORDER — TAMSULOSIN HCL 0.4 MG PO CAPS: 0.4000 mg | ORAL_CAPSULE | Freq: Every day | ORAL | 0 refills | Status: DC

## 2020-12-27 MED ORDER — LIDOCAINE HCL (CARDIAC) PF 100 MG/5ML IV SOSY
PREFILLED_SYRINGE | INTRAVENOUS | Status: DC | PRN
  Administered 2020-12-27: 60 mg via INTRAVENOUS

## 2020-12-27 MED ORDER — SODIUM CHLORIDE 0.9 % IR SOLN
Status: DC | PRN
  Administered 2020-12-27: 3000 mL

## 2020-12-27 MED ORDER — CEFAZOLIN SODIUM-DEXTROSE 2-4 GM/100ML-% IV SOLN
INTRAVENOUS | Status: AC
  Filled 2020-12-27: qty 100

## 2020-12-27 MED ORDER — ONDANSETRON HCL 4 MG/2ML IJ SOLN
INTRAMUSCULAR | Status: DC | PRN
  Administered 2020-12-27: 4 mg via INTRAVENOUS

## 2020-12-27 MED ORDER — 0.9 % SODIUM CHLORIDE (POUR BTL) OPTIME
TOPICAL | Status: DC | PRN
  Administered 2020-12-27: 500 mL

## 2020-12-27 MED ORDER — FENTANYL CITRATE (PF) 100 MCG/2ML IJ SOLN
INTRAMUSCULAR | Status: AC
  Filled 2020-12-27: qty 2

## 2020-12-27 MED ORDER — FENTANYL CITRATE (PF) 100 MCG/2ML IJ SOLN
INTRAMUSCULAR | Status: DC | PRN
  Administered 2020-12-27: 25 ug via INTRAVENOUS
  Administered 2020-12-27: 50 ug via INTRAVENOUS

## 2020-12-27 MED ORDER — DEXAMETHASONE SODIUM PHOSPHATE 4 MG/ML IJ SOLN
INTRAMUSCULAR | Status: DC | PRN
  Administered 2020-12-27: 4 mg via INTRAVENOUS

## 2020-12-27 MED ORDER — ONDANSETRON HCL 4 MG/2ML IJ SOLN
INTRAMUSCULAR | Status: AC
  Filled 2020-12-27: qty 2

## 2020-12-27 MED ORDER — PROPOFOL 10 MG/ML IV BOLUS
INTRAVENOUS | Status: DC | PRN
  Administered 2020-12-27: 120 mg via INTRAVENOUS

## 2020-12-27 MED ORDER — OXYBUTYNIN CHLORIDE 5 MG PO TABS: 5.0000 mg | ORAL_TABLET | Freq: Three times a day (TID) | ORAL | 0 refills | Status: DC | PRN

## 2020-12-27 MED ORDER — MIDAZOLAM HCL 2 MG/2ML IJ SOLN
INTRAMUSCULAR | Status: AC
  Filled 2020-12-27: qty 2

## 2020-12-27 MED ORDER — CEFAZOLIN SODIUM-DEXTROSE 2-4 GM/100ML-% IV SOLN
2.0000 g | INTRAVENOUS | Status: AC
  Administered 2020-12-27: 2 g via INTRAVENOUS

## 2020-12-27 MED ORDER — LIDOCAINE HCL (PF) 2 % IJ SOLN
INTRAMUSCULAR | Status: AC
  Filled 2020-12-27: qty 5

## 2020-12-27 MED ORDER — IOHEXOL 300 MG/ML  SOLN
INTRAMUSCULAR | Status: DC | PRN
  Administered 2020-12-27: 10 mL via URETHRAL

## 2020-12-27 MED ORDER — ACETAMINOPHEN 500 MG PO TABS
1000.0000 mg | ORAL_TABLET | Freq: Once | ORAL | Status: AC
  Administered 2020-12-27: 1000 mg via ORAL

## 2020-12-27 MED ORDER — EPHEDRINE SULFATE 50 MG/ML IJ SOLN
INTRAMUSCULAR | Status: DC | PRN
  Administered 2020-12-27 (×3): 10 mg via INTRAVENOUS

## 2020-12-27 MED ORDER — DEXAMETHASONE SODIUM PHOSPHATE 10 MG/ML IJ SOLN
INTRAMUSCULAR | Status: AC
  Filled 2020-12-27: qty 1

## 2020-12-27 MED ORDER — ACETAMINOPHEN 500 MG PO TABS
ORAL_TABLET | ORAL | Status: AC
  Filled 2020-12-27: qty 2

## 2020-12-27 MED ORDER — ONDANSETRON HCL 4 MG/2ML IJ SOLN: 4.0000 mg | Freq: Once | INTRAMUSCULAR | Status: DC | PRN

## 2020-12-27 MED ORDER — FENTANYL CITRATE (PF) 100 MCG/2ML IJ SOLN: 25.0000 ug | INTRAMUSCULAR | Status: DC | PRN

## 2020-12-27 MED ORDER — MIDAZOLAM HCL 5 MG/5ML IJ SOLN
INTRAMUSCULAR | Status: DC | PRN
  Administered 2020-12-27: 1 mg via INTRAVENOUS

## 2020-12-27 MED ORDER — LACTATED RINGERS IV SOLN: INTRAVENOUS | Status: DC

## 2020-12-27 MED ORDER — TRAMADOL HCL 50 MG PO TABS: 50.0000 mg | ORAL_TABLET | Freq: Four times a day (QID) | ORAL | 0 refills | Status: DC | PRN

## 2020-12-27 MED ORDER — PROPOFOL 10 MG/ML IV BOLUS
INTRAVENOUS | Status: AC
  Filled 2020-12-27: qty 20

## 2020-12-27 SURGICAL SUPPLY — 22 items
BAG DRAIN URO-CYSTO SKYTR STRL (DRAIN) ×3 IMPLANT
BAG DRN UROCATH (DRAIN) ×1
BASKET ZERO TIP NITINOL 2.4FR (BASKET) ×3 IMPLANT
BRUSH URET BIOPSY 3F (UROLOGICAL SUPPLIES) ×3 IMPLANT
BSKT STON RTRVL ZERO TP 2.4FR (BASKET) ×1
CATH URET 5FR 28IN OPEN ENDED (CATHETERS) ×3 IMPLANT
CLOTH BEACON ORANGE TIMEOUT ST (SAFETY) ×3 IMPLANT
DRSG TEGADERM 4X4.75 (GAUZE/BANDAGES/DRESSINGS) IMPLANT
EXTRACTOR STONE 1.7FRX115CM (UROLOGICAL SUPPLIES) IMPLANT
GLOVE SURG ENC MOIS LTX SZ6.5 (GLOVE) ×3 IMPLANT
GOWN STRL REUS W/TWL LRG LVL3 (GOWN DISPOSABLE) ×3 IMPLANT
GUIDEWIRE STR DUAL SENSOR (WIRE) ×6 IMPLANT
IV NS IRRIG 3000ML ARTHROMATIC (IV SOLUTION) ×3 IMPLANT
KIT TURNOVER CYSTO (KITS) ×3 IMPLANT
MANIFOLD NEPTUNE II (INSTRUMENTS) ×3 IMPLANT
PACK CYSTO (CUSTOM PROCEDURE TRAY) ×3 IMPLANT
SHEATH URETERAL 12FRX28CM (UROLOGICAL SUPPLIES) ×3 IMPLANT
TRACTIP FLEXIVA PULS ID 200XHI (Laser) ×1 IMPLANT
TRACTIP FLEXIVA PULSE ID 200 (Laser) ×3
TUBE CONNECTING 12'X1/4 (SUCTIONS) ×1
TUBE CONNECTING 12X1/4 (SUCTIONS) ×2 IMPLANT
TUBING UROLOGY SET (TUBING) ×3 IMPLANT

## 2020-12-27 NOTE — Anesthesia Procedure Notes (Signed)
Procedure Name: LMA Insertion Date/Time: 12/27/2020 10:13 AM Performed by: Maryella Shivers, CRNA Pre-anesthesia Checklist: Patient identified, Emergency Drugs available, Suction available and Patient being monitored Patient Re-evaluated:Patient Re-evaluated prior to induction Oxygen Delivery Method: Circle system utilized Preoxygenation: Pre-oxygenation with 100% oxygen Induction Type: IV induction Ventilation: Mask ventilation without difficulty LMA: LMA inserted LMA Size: 4.0 Number of attempts: 1 Airway Equipment and Method: Bite block Placement Confirmation: positive ETCO2 Tube secured with: Tape Dental Injury: Teeth and Oropharynx as per pre-operative assessment

## 2020-12-27 NOTE — Anesthesia Preprocedure Evaluation (Addendum)
Anesthesia Evaluation  Patient identified by MRN, date of birth, ID band Patient awake    Reviewed: Allergy & Precautions, NPO status , Patient's Chart, lab work & pertinent test results  History of Anesthesia Complications (+) Emergence Delirium  Airway Mallampati: II  TM Distance: >3 FB Neck ROM: Full    Dental  (+) Dental Advisory Given, Missing   Pulmonary neg pulmonary ROS,    Pulmonary exam normal breath sounds clear to auscultation       Cardiovascular negative cardio ROS Normal cardiovascular exam Rhythm:Regular Rate:Normal     Neuro/Psych negative neurological ROS     GI/Hepatic Neg liver ROS, GERD  ,  Endo/Other  negative endocrine ROS  Renal/GU RIGHT RENAL CALCULUS, ABNORMAL MUCOSA     Musculoskeletal negative musculoskeletal ROS (+)   Abdominal   Peds  Hematology negative hematology ROS (+)   Anesthesia Other Findings Day of surgery medications reviewed with the patient.  Reproductive/Obstetrics                            Anesthesia Physical Anesthesia Plan  ASA: 2  Anesthesia Plan: General   Post-op Pain Management:    Induction: Intravenous  PONV Risk Score and Plan: 4 or greater and Dexamethasone, Ondansetron and Treatment may vary due to age or medical condition  Airway Management Planned: LMA  Additional Equipment:   Intra-op Plan:   Post-operative Plan: Extubation in OR  Informed Consent: I have reviewed the patients History and Physical, chart, labs and discussed the procedure including the risks, benefits and alternatives for the proposed anesthesia with the patient or authorized representative who has indicated his/her understanding and acceptance.     Dental advisory given  Plan Discussed with: CRNA  Anesthesia Plan Comments:        Anesthesia Quick Evaluation

## 2020-12-27 NOTE — Op Note (Signed)
Preoperative diagnosis: Gross hematuria, right ureteral thickening/enhancement, right renal calculus  Postoperative diagnosis: Gross hematuria, right ureteral thickening/enhancement, right renal calculus  Procedure:  Cystoscopy right ureteroscopy, laser lithotripsy, basket stone extraction right 77F x 24cm ureteral stent placement -no tether right retrograde pyelography with interpretation  Surgeon: Jacalyn Lefevre, MD  Anesthesia: General  Complications: None  Intraoperative findings:  Normal urethra Bilateral orthotropic ureteral orifices Right retrograde pyelography demonstrated narrowing at UPJ Bladder mucosa normal without masses   EBL: Minimal  Specimens: Right renal calculus Right ureteral washing Right ureteral brush biopsy  Disposition of specimens: [facility] for stone analysis  Indication: Dorothy Stone is a 75 y.o.   patient who initially presented with gross hematuria and right flank pain found to have right proximal ureteral enhancement as well as a nonobstructing lower pole calculus.  After reviewing the management options for treatment, the patient elected to proceed with the above surgical procedure(s). We have discussed the potential benefits and risks of the procedure, side effects of the proposed treatment, the likelihood of the patient achieving the goals of the procedure, and any potential problems that might occur during the procedure or recuperation. Informed consent has been obtained.   Description of procedure:  The patient was taken to the operating room and general anesthesia was induced.  The patient was placed in the dorsal lithotomy position, prepped and draped in the usual sterile fashion, and preoperative antibiotics were administered. A preoperative time-out was performed.   Cystourethroscopy was performed.  The patient's urethra was examined and was normal. The bladder was then systematically examined in its entirety. There  was no evidence for any bladder tumors, stones, or other mucosal pathology.    Attention then turned to the right ureteral orifice and a ureteral catheter was used to intubate the ureteral orifice.  Omnipaque contrast was injected through the ureteral catheter and a retrograde pyelogram was performed with findings as dictated above.  A 0.38 sensor guidewire was then advanced up the right ureter into the renal pelvis under fluoroscopic guidance. The 65Fr semirigid ureteroscope was then advanced into the ureter next to the guidewire to the level of the renal pelvis.  At this point time there was no abnormality noted and no ureteral mass.  There was a slight narrowing at the UPJ and a brush biopsy was obtained.  Then a second sensor wire was placed through the ureteroscope and the ureteroscope was removed.  A ureteral access sheath was then placed over the second wire with fluoroscopic guidance.  The inner sheath and wire removed.    Flexible ureteroscopy then took place.  Again a narrowing at the UPJ was seen however the scope was easily able to traverse this area.  An 8 mm pole was seen in the right lower pole.  The stone was then fragmented with the 200 micron holmium laser fiber.  All stones were then removed from the ureter with a 0 tip basket.  Reinspection of the ureter revealed no remaining visible stones or fragments.   The wire was then backloaded through the cystoscope and a ureteral stent was advance over the wire using Seldinger technique.  The stent was positioned appropriately under fluoroscopic and cystoscopic guidance.  The wire was then removed with an adequate stent curl noted in the renal pelvis as well as in the bladder.  The bladder was then emptied and the procedure ended.  The patient appeared to tolerate the procedure well and without complications.  The patient was able to be  awakened and transferred to the recovery unit in satisfactory condition.   Disposition: The ureteral  stent will stay in place for 2 weeks and be removed in the office with cystoscopic guidance.  She will need a postoperative ultrasound in about 6 weeks.

## 2020-12-27 NOTE — Transfer of Care (Signed)
Immediate Anesthesia Transfer of Care Note  Patient: Dorothy Stone  Procedure(s) Performed: CYSTOSCOPY/URETEROSCOPY/HOLMIUM LASER/STENT PLACEMENT,WASHINGS AND  BIOPSY (Right: Ureter)  Patient Location: PACU  Anesthesia Type:General  Level of Consciousness: drowsy  Airway & Oxygen Therapy: Patient Spontanous Breathing and Patient connected to nasal cannula oxygen  Post-op Assessment: Report given to RN  Post vital signs: Reviewed and stable  Last Vitals:  Vitals Value Taken Time  BP 119/52 12/27/20 1126  Temp    Pulse 74 12/27/20 1128  Resp 17 12/27/20 1128  SpO2 99 % 12/27/20 1128  Vitals shown include unvalidated device data.  Last Pain:  Vitals:   12/27/20 0843  TempSrc: Oral  PainSc: 0-No pain      Patients Stated Pain Goal: 3 (59/09/31 1216)  Complications: No notable events documented.

## 2020-12-27 NOTE — Discharge Instructions (Addendum)
DISCHARGE INSTRUCTIONS FOR KIDNEY STONE/URETERAL STENT   MEDICATIONS:  1. Resume all your other meds from home  2. AZO over the counter can help with the burning/stinging when you urinate. 3. Tramadol is for moderate/severe pain, otherwise taking up to 1000 mg every 6 hours of plainTylenol will help treat your pain.   4. Take tamuslosin to help with stent discomfort.  5.  Oxybutynin can help with urinary frequency and urgency as well as bladder cramping due to the stent    ACTIVITY:  1. No strenuous activity x 1week  2. No driving while on narcotic pain medications  3. Drink plenty of water  4. Continue to walk at home - you can still get blood clots when you are at home, so keep active, but don't over do it.  5. May return to work/school tomorrow or when you feel ready   BATHING:  1. You can shower and we recommend daily showers  2  SIGNS/SYMPTOMS TO CALL:  Please call us if you have a fever greater than 101.5, uncontrolled nausea/vomiting, uncontrolled pain, dizziness, unable to urinate, bloody urine, chest pain, shortness of breath, leg swelling, leg pain, redness around wound, drainage from wound, or any other concerns or questions.   You can reach [place] at [phone].   FOLLOW-UP:  1.  You will be contacted for a follow-up date to have stent removed.   Post Anesthesia Home Care Instructions  Activity: Get plenty of rest for the remainder of the day. A responsible individual must stay with you for 24 hours following the procedure.  For the next 24 hours, DO NOT: -Drive a car -Paediatric nurse -Drink alcoholic beverages -Take any medication unless instructed by your physician -Make any legal decisions or sign important papers.  Meals: Start with liquid foods such as gelatin or soup. Progress to regular foods as tolerated. Avoid greasy, spicy, heavy foods. If nausea and/or vomiting occur, drink only clear liquids until the nausea and/or vomiting subsides. Call your  physician if vomiting continues.  Special Instructions/Symptoms: Your throat may feel dry or sore from the anesthesia or the breathing tube placed in your throat during surgery. If this causes discomfort, gargle with warm salt water. The discomfort should disappear within 24 hours.

## 2020-12-27 NOTE — Interval H&P Note (Signed)
History and Physical Interval Note:  12/27/2020 9:37 AM  Dorothy Stone  has presented today for surgery, with the diagnosis of RIGHT RENAL CALCULUS, ABNORMAL MUCOSA.  The various methods of treatment have been discussed with the patient and family. After consideration of risks, benefits and other options for treatment, the patient has consented to  Procedure(s): CYSTOSCOPY/URETEROSCOPY/HOLMIUM LASER/STENT PLACEMENT,WASHINGS AND POSSIBLE BIOPSY (Right) as a surgical intervention.  The patient's history has been reviewed, patient examined, no change in status, stable for surgery.  I have reviewed the patient's chart and labs.  Questions were answered to the patient's satisfaction.     Kimblery Diop D Arlys Scatena

## 2020-12-28 ENCOUNTER — Encounter (HOSPITAL_BASED_OUTPATIENT_CLINIC_OR_DEPARTMENT_OTHER): Payer: Self-pay | Admitting: Urology

## 2020-12-28 LAB — CYTOLOGY - NON PAP

## 2020-12-28 NOTE — Anesthesia Postprocedure Evaluation (Signed)
Anesthesia Post Note  Patient: Dorothy Stone  Procedure(s) Performed: CYSTOSCOPY/URETEROSCOPY/HOLMIUM LASER/STENT PLACEMENT,WASHINGS AND  BIOPSY (Right: Ureter)     Patient location during evaluation: PACU Anesthesia Type: General Level of consciousness: awake and alert Pain management: pain level controlled Vital Signs Assessment: post-procedure vital signs reviewed and stable Respiratory status: spontaneous breathing, nonlabored ventilation, respiratory function stable and patient connected to nasal cannula oxygen Cardiovascular status: blood pressure returned to baseline and stable Postop Assessment: no apparent nausea or vomiting Anesthetic complications: no   No notable events documented.  Last Vitals:  Vitals:   12/27/20 1200 12/27/20 1237  BP: (!) 114/57 126/72  Pulse: 74 77  Resp: 16 16  Temp: 36.4 C 36.4 C  SpO2: 94% 96%    Last Pain:  Vitals:   12/27/20 1237  TempSrc:   PainSc: 2                  Catalina Gravel

## 2021-01-09 DIAGNOSIS — R1084 Generalized abdominal pain: Secondary | ICD-10-CM | POA: Diagnosis not present

## 2021-01-11 DIAGNOSIS — R8279 Other abnormal findings on microbiological examination of urine: Secondary | ICD-10-CM | POA: Diagnosis not present

## 2021-01-11 DIAGNOSIS — N200 Calculus of kidney: Secondary | ICD-10-CM | POA: Diagnosis not present

## 2021-02-06 DIAGNOSIS — N200 Calculus of kidney: Secondary | ICD-10-CM | POA: Diagnosis not present

## 2021-02-06 DIAGNOSIS — N130 Hydronephrosis with ureteropelvic junction obstruction: Secondary | ICD-10-CM | POA: Diagnosis not present

## 2021-02-06 DIAGNOSIS — N281 Cyst of kidney, acquired: Secondary | ICD-10-CM | POA: Diagnosis not present

## 2021-02-22 DIAGNOSIS — D1801 Hemangioma of skin and subcutaneous tissue: Secondary | ICD-10-CM | POA: Diagnosis not present

## 2021-02-22 DIAGNOSIS — Z85828 Personal history of other malignant neoplasm of skin: Secondary | ICD-10-CM | POA: Diagnosis not present

## 2021-02-22 DIAGNOSIS — L570 Actinic keratosis: Secondary | ICD-10-CM | POA: Diagnosis not present

## 2021-02-22 DIAGNOSIS — D692 Other nonthrombocytopenic purpura: Secondary | ICD-10-CM | POA: Diagnosis not present

## 2021-02-22 DIAGNOSIS — D2271 Melanocytic nevi of right lower limb, including hip: Secondary | ICD-10-CM | POA: Diagnosis not present

## 2021-02-22 DIAGNOSIS — L814 Other melanin hyperpigmentation: Secondary | ICD-10-CM | POA: Diagnosis not present

## 2021-02-22 DIAGNOSIS — L821 Other seborrheic keratosis: Secondary | ICD-10-CM | POA: Diagnosis not present

## 2021-02-22 DIAGNOSIS — D225 Melanocytic nevi of trunk: Secondary | ICD-10-CM | POA: Diagnosis not present

## 2021-04-17 ENCOUNTER — Emergency Department (HOSPITAL_COMMUNITY): Payer: Medicare Other

## 2021-04-17 ENCOUNTER — Other Ambulatory Visit: Payer: Self-pay

## 2021-04-17 ENCOUNTER — Encounter (HOSPITAL_COMMUNITY): Payer: Self-pay | Admitting: Emergency Medicine

## 2021-04-17 ENCOUNTER — Emergency Department (HOSPITAL_COMMUNITY)
Admission: EM | Admit: 2021-04-17 | Discharge: 2021-04-18 | Disposition: A | Discharge status: Home / Self Care | Payer: Medicare Other | Attending: Emergency Medicine | Admitting: Emergency Medicine

## 2021-04-17 DIAGNOSIS — K449 Diaphragmatic hernia without obstruction or gangrene: Secondary | ICD-10-CM | POA: Diagnosis not present

## 2021-04-17 DIAGNOSIS — Z85828 Personal history of other malignant neoplasm of skin: Secondary | ICD-10-CM | POA: Insufficient documentation

## 2021-04-17 DIAGNOSIS — K4020 Bilateral inguinal hernia, without obstruction or gangrene, not specified as recurrent: Secondary | ICD-10-CM | POA: Diagnosis not present

## 2021-04-17 DIAGNOSIS — R0789 Other chest pain: Secondary | ICD-10-CM | POA: Diagnosis not present

## 2021-04-17 DIAGNOSIS — R079 Chest pain, unspecified: Secondary | ICD-10-CM | POA: Insufficient documentation

## 2021-04-17 DIAGNOSIS — K439 Ventral hernia without obstruction or gangrene: Secondary | ICD-10-CM | POA: Insufficient documentation

## 2021-04-17 DIAGNOSIS — K5730 Diverticulosis of large intestine without perforation or abscess without bleeding: Secondary | ICD-10-CM | POA: Diagnosis not present

## 2021-04-17 DIAGNOSIS — R109 Unspecified abdominal pain: Secondary | ICD-10-CM | POA: Diagnosis present

## 2021-04-17 LAB — LIPASE, BLOOD: Lipase: 36 U/L (ref 11–51)

## 2021-04-17 LAB — COMPREHENSIVE METABOLIC PANEL
ALT: 16 U/L (ref 0–44)
AST: 20 U/L (ref 15–41)
Albumin: 3.7 g/dL (ref 3.5–5.0)
Alkaline Phosphatase: 61 U/L (ref 38–126)
Anion gap: 9 (ref 5–15)
BUN: 16 mg/dL (ref 8–23)
CO2: 27 mmol/L (ref 22–32)
Calcium: 9.5 mg/dL (ref 8.9–10.3)
Chloride: 102 mmol/L (ref 98–111)
Creatinine, Ser: 0.77 mg/dL (ref 0.44–1.00)
GFR, Estimated: >60 mL/min (ref >60)
Glucose, Bld: 151 mg/dL — ABNORMAL HIGH (ref 70–99)
Potassium: 3.7 mmol/L (ref 3.5–5.1)
Sodium: 138 mmol/L (ref 135–145)
Total Bilirubin: 1 mg/dL (ref 0.3–1.2)
Total Protein: 6.3 g/dL — ABNORMAL LOW (ref 6.5–8.1)

## 2021-04-17 LAB — CBC WITH DIFFERENTIAL/PLATELET
Abs Immature Granulocytes: 0.03 10*3/uL (ref 0.00–0.07)
Basophils Absolute: 0 10*3/uL (ref 0.0–0.1)
Basophils Relative: 1 %
Eosinophils Absolute: 0.1 10*3/uL (ref 0.0–0.5)
Eosinophils Relative: 1 %
HCT: 40.9 % (ref 36.0–46.0)
Hemoglobin: 13.2 g/dL (ref 12.0–15.0)
Immature Granulocytes: 0 %
Lymphocytes Relative: 16 %
Lymphs Abs: 1.2 10*3/uL (ref 0.7–4.0)
MCH: 30.1 pg (ref 26.0–34.0)
MCHC: 32.3 g/dL (ref 30.0–36.0)
MCV: 93.2 fL (ref 80.0–100.0)
Monocytes Absolute: 0.6 10*3/uL (ref 0.1–1.0)
Monocytes Relative: 8 %
Neutro Abs: 5.4 10*3/uL (ref 1.7–7.7)
Neutrophils Relative %: 74 %
Platelets: 239 10*3/uL (ref 150–400)
RBC: 4.39 MIL/uL (ref 3.87–5.11)
RDW: 12.3 % (ref 11.5–15.5)
WBC: 7.3 10*3/uL (ref 4.0–10.5)
nRBC: 0 % (ref 0.0–0.2)

## 2021-04-17 LAB — URINALYSIS, ROUTINE W REFLEX MICROSCOPIC
Bilirubin Urine: NEGATIVE
Glucose, UA: NEGATIVE mg/dL
Hgb urine dipstick: NEGATIVE
Ketones, ur: NEGATIVE mg/dL
Leukocytes,Ua: NEGATIVE
Nitrite: NEGATIVE
Protein, ur: NEGATIVE mg/dL
Specific Gravity, Urine: 1.015 (ref 1.005–1.030)
pH: 6 (ref 5.0–8.0)

## 2021-04-17 LAB — TROPONIN I (HIGH SENSITIVITY)
Troponin I (High Sensitivity): 2 ng/L (ref <18)
Troponin I (High Sensitivity): 3 ng/L (ref <18)

## 2021-04-17 NOTE — ED Triage Notes (Signed)
Patient reports intermittent/chronic left chest pain radiating to left shoulder/left upper back and left jaw for several months , she added left abdominal pain with nausea and diaphoresis today .

## 2021-04-17 NOTE — ED Provider Notes (Signed)
Emergency Medicine Provider Triage Evaluation Note  Dorothy Stone , a 75 y.o. female  was evaluated in triage.  Pt complains of 2-3 months of intermittent CP. Today pt began experiencing abd pain as well Endorses nausea without vomiting.  No SOB, cough, hemoptysis.  No fever or chills.   Denies any blood in her stool.  Review of Systems  Positive: Abd pain, CP, Nausea (no vomiting) Negative: fever  Physical Exam  BP (!) 162/79 (BP Location: Right Arm)   Pulse 77   Temp 98.7 F (37.1 C) (Oral)   Resp 18   SpO2 98%  Gen:   Awake, no distress  anxious Resp:  Normal effort  MSK:   Moves extremities without difficulty  Other:  Abd TTP in LLQ  Medical Decision Making  Medically screening exam initiated at 8:03 PM.  Appropriate orders placed.  TIASIA WEBERG was informed that the remainder of the evaluation will be completed by another provider, this initial triage assessment does not replace that evaluation, and the importance of remaining in the ED until their evaluation is complete.  CT abd pelv w contrast to evaluate for diverticulitis or other intra-abdominal pathology.  Chest x-ray and chest pain work-up ordered as well.  Patient very atypical sounding chest pain ongoing for 2 or 3 months.   Pati Gallo [place], [place] 04/17/21 2010    Sherwood Gambler, MD 04/18/21 1527

## 2021-04-18 ENCOUNTER — Emergency Department (HOSPITAL_COMMUNITY): Payer: Medicare Other

## 2021-04-18 DIAGNOSIS — K439 Ventral hernia without obstruction or gangrene: Secondary | ICD-10-CM | POA: Diagnosis not present

## 2021-04-18 DIAGNOSIS — K449 Diaphragmatic hernia without obstruction or gangrene: Secondary | ICD-10-CM | POA: Diagnosis not present

## 2021-04-18 DIAGNOSIS — K4020 Bilateral inguinal hernia, without obstruction or gangrene, not specified as recurrent: Secondary | ICD-10-CM | POA: Diagnosis not present

## 2021-04-18 DIAGNOSIS — K5730 Diverticulosis of large intestine without perforation or abscess without bleeding: Secondary | ICD-10-CM | POA: Diagnosis not present

## 2021-04-18 MED ORDER — FAMOTIDINE 20 MG PO TABS
20.0000 mg | ORAL_TABLET | Freq: Once | ORAL | Status: AC
  Administered 2021-04-18: 20 mg via ORAL
  Filled 2021-04-18: qty 1

## 2021-04-18 MED ORDER — IOHEXOL 350 MG/ML SOLN
75.0000 mL | Freq: Once | INTRAVENOUS | Status: AC | PRN
  Administered 2021-04-18: 75 mL via INTRAVENOUS

## 2021-04-18 MED ORDER — PANTOPRAZOLE SODIUM 20 MG PO TBEC: 20.0000 mg | DELAYED_RELEASE_TABLET | Freq: Every day | ORAL | 1 refills | Status: DC

## 2021-04-18 MED ORDER — SUCRALFATE 1 GM/10ML PO SUSP: 1.0000 g | Freq: Three times a day (TID) | ORAL | 0 refills | Status: DC

## 2021-04-18 MED ORDER — LIDOCAINE VISCOUS HCL 2 % MT SOLN
15.0000 mL | Freq: Once | OROMUCOSAL | Status: AC
  Administered 2021-04-18: 15 mL via ORAL
  Filled 2021-04-18: qty 15

## 2021-04-18 MED ORDER — ALUM & MAG HYDROXIDE-SIMETH 200-200-20 MG/5ML PO SUSP
30.0000 mL | Freq: Once | ORAL | Status: AC
  Administered 2021-04-18: 30 mL via ORAL
  Filled 2021-04-18: qty 30

## 2021-04-18 MED ORDER — PANTOPRAZOLE SODIUM 40 MG PO TBEC: 40.0000 mg | DELAYED_RELEASE_TABLET | Freq: Every day | ORAL | Status: DC

## 2021-04-18 NOTE — ED Notes (Signed)
Patient verbalizes understanding of discharge instructions. Opportunity for questioning and answers were provided. Armband removed by staff, pt discharged from ED via wheelchair to lobby to return home with SO.

## 2021-04-18 NOTE — ED Notes (Signed)
Patient transported to CT 

## 2021-04-18 NOTE — ED Provider Notes (Signed)
[facility] EMERGENCY DEPARTMENT Provider Note   CSN: [id number] Arrival date & time: 04/17/21  1826     History Chief Complaint  Patient presents with   Chest Pain   Abdominal Pain    Dorothy Stone is a 75 y.o. female.  74 year old female with intermittent chest and abdominal pain for the last couple months.  Found to have a hiatal hernia previously.  She has GI follow-up soon.  There is no obvious causing factors for her pain.  She is worried about her heart so she came here today.  She has no shortness of breath.  She has no nausea or vomiting.  She is passing gas and having normal bowel movements.   Chest Pain Associated symptoms: abdominal pain   Abdominal Pain Associated symptoms: chest pain       Past Medical History:  Diagnosis Date   Cancer ([place])    basal cell carcinoma   History of kidney stones    Hyperlipidemia     Patient Active Problem List   Diagnosis Date Noted   Chest pain 07/14/2015   Palpitations 07/14/2015   GERD 07/14/2010    Past Surgical History:  Procedure Laterality Date   BASAL CELL CARCINOMA EXCISION  2018   left leg below knee   CYSTOSCOPY/URETEROSCOPY/HOLMIUM LASER/STENT PLACEMENT Right 12/27/2020   Procedure: CYSTOSCOPY/URETEROSCOPY/HOLMIUM LASER/STENT PLACEMENT,WASHINGS AND  BIOPSY;  Surgeon: Robley Fries, MD;  Location: [place];  Service: Urology;  Laterality: Right;   EYE SURGERY Bilateral 2019   cataracts removed with lens impants     OB History   No obstetric history on file.     Family History  Problem Relation Age of Onset   Arthritis Other    Hyperlipidemia Father    Ovarian cancer Mother    Colon polyps Father    Colon cancer Paternal Grandmother    Stroke Mother    GI problems Mother    CAD Mother    Heart block Brother    CAD Brother     Social History   Tobacco Use   Smoking status: Never   Smokeless tobacco: Never  Vaping Use   Vaping Use: Never used   Substance Use Topics   Alcohol use: No   Drug use: No    Home Medications Prior to Admission medications   Medication Sig Start Date End Date Taking? Authorizing Provider  pantoprazole (PROTONIX) 20 MG tablet Take 1 tablet (20 mg total) by mouth daily. 04/18/21  Yes Aiyonna Lucado, Corene Cornea, MD  sucralfate (CARAFATE) 1 GM/10ML suspension Take 10 mLs (1 g total) by mouth 4 (four) times daily -  with meals and at bedtime. 04/18/21  Yes Jkayla Spiewak, Corene Cornea, MD  co-enzyme Q-10 30 MG capsule Take 30 mg by mouth 2 (two) times daily.    [provider]  COCONUT OIL PO Take by mouth as needed. Takes very small amount in teaspoon prn    [provider]  OLIVE LEAF EXTRACT PO Take by mouth 2 (two) times daily.    [provider]  OVER THE COUNTER MEDICATION Ultimate mega vitamin d 3 daily    [provider]  oxybutynin (DITROPAN) 5 MG tablet Take 1 tablet (5 mg total) by mouth every 8 (eight) hours as needed for bladder spasms. 12/27/20   Robley Fries, MD  Probiotic Product (PROBIOTIC FORMULA) CAPS Take 2 capsules by mouth daily.    [provider]  tamsulosin (FLOMAX) 0.4 MG CAPS capsule Take 1 capsule (  0.4 mg total) by mouth daily. 12/27/20   Robley Fries, MD  traMADol (ULTRAM) 50 MG tablet Take 1 tablet (50 mg total) by mouth every 6 (six) hours as needed. 12/27/20 12/27/21  Robley Fries, MD    Allergies    Patient has no known allergies.  Review of Systems   Review of Systems  Cardiovascular:  Positive for chest pain.  Gastrointestinal:  Positive for abdominal pain.  All other systems reviewed and are negative.  Physical Exam Updated Vital Signs BP 116/73 (BP Location: Right Arm)   Pulse 70   Temp 97.8 F (36.6 C)   Resp 16   Ht 5\' 2"  (1.575 m)   Wt 75 kg   SpO2 95%   BMI 30.24 kg/m   Physical Exam Vitals and nursing note reviewed.  Constitutional:      Appearance: She is well-developed.  HENT:     Head: Normocephalic and atraumatic.   Eyes:     Pupils: Pupils are equal, round, and reactive to light.  Cardiovascular:     Rate and Rhythm: Normal rate and regular rhythm.  Pulmonary:     Effort: Pulmonary effort is normal. No respiratory distress.     Breath sounds: No stridor.  Abdominal:     General: Abdomen is flat. There is no distension.     Tenderness: There is no abdominal tenderness.  Musculoskeletal:        General: No swelling or tenderness. Normal range of motion.     Cervical back: Normal range of motion.  Skin:    General: Skin is warm and dry.  Neurological:     General: No focal deficit present.     Mental Status: She is alert.    ED Results / Procedures / Treatments   Labs (all labs ordered are listed, but only abnormal results are displayed) Labs Reviewed  COMPREHENSIVE METABOLIC PANEL - Abnormal; Notable for the following components:      Result Value   Glucose, Bld 151 (*)    Total Protein 6.3 (*)    All other components within normal limits  URINALYSIS, ROUTINE W REFLEX MICROSCOPIC - Abnormal; Notable for the following components:   APPearance HAZY (*)    All other components within normal limits  CBC WITH DIFFERENTIAL/PLATELET  LIPASE, BLOOD  TROPONIN I (HIGH SENSITIVITY)  TROPONIN I (HIGH SENSITIVITY)    EKG None  Radiology DG Chest 2 View  Result Date: 04/17/2021 CLINICAL DATA:  Chest pain EXAM: CHEST - 2 VIEW COMPARISON:  None. FINDINGS: Heart size and pulmonary vascularity are normal. Linear fibrosis or atelectasis in the left lung base. No airspace disease or consolidation in the lungs. No pleural effusions. No pneumothorax. Mediastinal contours appear intact. Small esophageal hiatal hernia behind the heart. IMPRESSION: Fibrosis or atelectasis in the left lung base. No focal consolidation. Electronically Signed   By: Lucienne Capers M.D.   On: 04/17/2021 20:43   CT ABDOMEN PELVIS W CONTRAST  Result Date: 04/18/2021 CLINICAL DATA:  Left lower quadrant pain EXAM: CT ABDOMEN  AND PELVIS WITH CONTRAST TECHNIQUE: Multidetector CT imaging of the abdomen and pelvis was performed using the standard protocol following bolus administration of intravenous contrast. CONTRAST:  50mL OMNIPAQUE IOHEXOL 350 MG/ML SOLN COMPARISON:  11/07/2020 FINDINGS: Lower chest: Hiatal hernia with reduction of the stomach and increased fat herniation with similar degree of reticulation. Mild adjacent atelectasis or scarring. Hepatobiliary: No focal liver abnormality.No evidence of biliary obstruction or stone. Pancreas: Stable Spleen: Unremarkable. Adrenals/Urinary Tract: Negative  adrenals. No hydronephrosis or stone. Unremarkable bladder. Stomach/Bowel: No obstruction. No appendicitis or other visible bowel inflammation. Extensive left colonic diverticulosis. Low left spigelian hernia which contains small bowel in distinction of prior. The herniated bowel is not obstructed and not thickened. Vascular/Lymphatic: No acute vascular abnormality. Diffuse atheromatous calcification of the aorta. No mass or adenopathy. Reproductive:No pathologic findings. Other: No ascites or pneumoperitoneum. Fatty bilateral inguinal hernia. Musculoskeletal: No acute abnormalities. Focal advanced disc degeneration at L4-5. Notable facet degeneration at L3-4 with mild anterolisthesis and moderate spinal stenosis. L5-S1 ankylosis. IMPRESSION: 1. Chronic low left spigelian hernia which now contains nonobstructed small bowel. 2. Large hiatal hernia with interval reduction of the stomach and herniation of omentum. 3. Chronic, fatty bilateral inguinal hernias. 4. Extensive left colonic diverticulosis without acute inflammation. Electronically Signed   By: Jorje Guild M.D.   On: 04/18/2021 04:26    Procedures Procedures   Medications Ordered in ED Medications  pantoprazole (PROTONIX) EC tablet 40 mg (has no administration in time range)  alum & mag hydroxide-simeth (MAALOX/MYLANTA) 200-200-20 MG/5ML suspension 30 mL (30 mLs Oral  Given 04/18/21 0413)    And  lidocaine (XYLOCAINE) 2 % viscous mouth solution 15 mL (15 mLs Oral Given 04/18/21 0413)  famotidine (PEPCID) tablet 20 mg (20 mg Oral Given 04/18/21 0413)  iohexol (OMNIPAQUE) 350 MG/ML injection 75 mL (75 mLs Intravenous Contrast Given 04/18/21 0409)    ED Course  I have reviewed the triage vital signs and the nursing notes.  Pertinent labs & imaging results that were available during my care of the patient were reviewed by me and considered in my medical decision making (see chart for details).    MDM Rules/Calculators/A&P                         I suspect she is has a symptomatic hiatal hernia but there is no indication for emergent intervention at this time.  Refer to CT surgery for same.  She is also found to have spigelian hernia but this does not show obstruction I do not think it is related to the symptoms today.  We will initiate some proton pump inhibitor and have her follow-up with CT surgery.  Patient and husband aware of findings and plan.  Final Clinical Impression(s) / ED Diagnoses Final diagnoses:  Hiatal hernia  Spigelian hernia    Rx / DC Orders ED Discharge Orders          Ordered    pantoprazole (PROTONIX) 20 MG tablet  Daily        04/18/21 0521    Ambulatory referral to Cardiothoracic Surgery        04/18/21 0521    sucralfate (CARAFATE) 1 GM/10ML suspension  3 times daily with meals & bedtime        04/18/21 0521             Nivaan Dicenzo, Corene Cornea, MD 04/18/21 [phone]

## 2021-04-20 DIAGNOSIS — R7309 Other abnormal glucose: Secondary | ICD-10-CM | POA: Diagnosis not present

## 2021-04-20 DIAGNOSIS — K449 Diaphragmatic hernia without obstruction or gangrene: Secondary | ICD-10-CM | POA: Diagnosis not present

## 2021-04-21 ENCOUNTER — Other Ambulatory Visit: Payer: Self-pay | Admitting: Family Medicine

## 2021-04-21 DIAGNOSIS — K449 Diaphragmatic hernia without obstruction or gangrene: Secondary | ICD-10-CM

## 2021-05-08 ENCOUNTER — Ambulatory Visit
Admission: RE | Admit: 2021-05-08 | Discharge: 2021-05-08 | Disposition: A | Discharge status: Home / Self Care | Payer: Medicare Other | Source: Ambulatory Visit | Attending: Family Medicine | Admitting: Family Medicine

## 2021-05-08 ENCOUNTER — Other Ambulatory Visit: Payer: Self-pay

## 2021-05-08 DIAGNOSIS — J9811 Atelectasis: Secondary | ICD-10-CM | POA: Diagnosis not present

## 2021-05-08 DIAGNOSIS — K449 Diaphragmatic hernia without obstruction or gangrene: Secondary | ICD-10-CM

## 2021-05-08 DIAGNOSIS — R911 Solitary pulmonary nodule: Secondary | ICD-10-CM | POA: Diagnosis not present

## 2021-05-08 DIAGNOSIS — R918 Other nonspecific abnormal finding of lung field: Secondary | ICD-10-CM | POA: Diagnosis not present

## 2021-05-08 DIAGNOSIS — I3139 Other pericardial effusion (noninflammatory): Secondary | ICD-10-CM | POA: Diagnosis not present

## 2021-05-11 ENCOUNTER — Other Ambulatory Visit: Payer: Self-pay | Admitting: Family Medicine

## 2021-05-11 DIAGNOSIS — E041 Nontoxic single thyroid nodule: Secondary | ICD-10-CM

## 2021-05-11 NOTE — H&P (View-Only) (Signed)
[address]       [place] [address]             [id number]                    Jorie L Nedd [place] Medical Record #[id number] Date of Birth: 04-10-1946  Referring: Merrily Pew, MD Primary Care: Maurice Small, MD (Inactive) Primary Cardiologist: None  Chief Complaint:    Chief Complaint  Patient presents with   Hiatal Hernia    Surgical consult, ED CT Chest 05/08/21, CT ABD/Pelvis 04/18/21    History of Present Illness:    Dorothy Stone 75 y.o. female presents to discuss surgical management of the paraesophageal hernia.  The patient states that over the last several months she has had worsening right costal and rib pain every time that she eats.  This mostly occurs if she has too much to eat this has been decreasing the size of her meals which has been effective.  She originally presented to the emergency department was noted to have a kidney stone but also found to have this paraesophageal hernia.  When she thinks back there is been several years of her having this pain which is associated with large meals.  She also has a remote history of reflux and has at one point been on regular Tums medication with symptomatic relief.  She has not been on any antireflux medication in several years.  She has lost about 11 pounds over the last 3 months.  She also has a dry cough regularly.    Zubrod Score: At the time of surgery this patient's most appropriate activity status/level should be described as: [x]     0    Normal activity, no symptoms []     1    Restricted in physical strenuous activity but ambulatory, able to do out light work []     2    Ambulatory and capable of self care, unable to do work activities, up and about               >50 % of waking hours                              []     3    Only limited self care, in bed greater than 50% of waking hours []     4    Completely disabled, no self care, confined to bed or chair []     5    Moribund   Past  Medical History:  Diagnosis Date   Cancer ([place])    basal cell carcinoma   History of kidney stones    Hyperlipidemia     Past Surgical History:  Procedure Laterality Date   BASAL CELL CARCINOMA EXCISION  2018   left leg below knee   CYSTOSCOPY/URETEROSCOPY/HOLMIUM LASER/STENT PLACEMENT Right 12/27/2020   Procedure: CYSTOSCOPY/URETEROSCOPY/HOLMIUM LASER/STENT PLACEMENT,WASHINGS AND  BIOPSY;  Surgeon: Robley Fries, MD;  Location: [facility];  Service: Urology;  Laterality: Right;   EYE SURGERY Bilateral 2019   cataracts removed with lens impants    Family History  Problem Relation Age of Onset   Arthritis Other    Hyperlipidemia Father    Ovarian cancer Mother    Colon polyps Father    Colon cancer Paternal Grandmother    Stroke Mother    GI problems Mother    CAD Mother  Heart block Brother    CAD Brother      Social History   Tobacco Use  Smoking Status Never  Smokeless Tobacco Never    Social History   Substance and Sexual Activity  Alcohol Use No     No Known Allergies  Current Outpatient Medications  Medication Sig Dispense Refill   co-enzyme Q-10 30 MG capsule Take 30 mg by mouth 2 (two) times daily.     COCONUT OIL PO Take by mouth as needed. Takes very small amount in teaspoon prn     Fish Oil-Cholecalciferol (OMEGA-3 + VITAMIN D3 PO) Take by mouth.     OLIVE LEAF EXTRACT PO Take by mouth 2 (two) times daily.     OVER THE COUNTER MEDICATION Ultimate mega vitamin d 3 daily     pantoprazole (PROTONIX) 20 MG tablet Take 1 tablet (20 mg total) by mouth daily. 30 tablet 1   Probiotic Product (PROBIOTIC FORMULA) CAPS Take 2 capsules by mouth daily.     sucralfate (CARAFATE) 1 GM/10ML suspension Take 10 mLs (1 g total) by mouth 4 (four) times daily -  with meals and at bedtime. 420 mL 0   tamsulosin (FLOMAX) 0.4 MG CAPS capsule Take 1 capsule (0.4 mg total) by mouth daily. 30 capsule 0   oxybutynin (DITROPAN) 5 MG tablet Take 1 tablet (5  mg total) by mouth every 8 (eight) hours as needed for bladder spasms. (Patient not taking: Reported on 05/12/2021) 30 tablet 0   traMADol (ULTRAM) 50 MG tablet Take 1 tablet (50 mg total) by mouth every 6 (six) hours as needed. (Patient not taking: Reported on 05/12/2021) 15 tablet 0   No current facility-administered medications for this visit.    Review of Systems  Constitutional:  Positive for weight loss.  Respiratory:  Negative for shortness of breath.   Cardiovascular:  Positive for chest pain.  Gastrointestinal:  Positive for abdominal pain and heartburn.    PHYSICAL EXAMINATION: BP 120/78 (BP Location: Left Arm, Patient Position: Sitting)   Pulse 86   Resp 20   Ht 5\' 2"  (1.575 m)   Wt 149 lb (67.6 kg)   SpO2 97% Comment: RA  BMI 27.25 kg/m  Physical Exam Constitutional:      General: She is not in acute distress.    Appearance: Normal appearance. She is normal weight. She is not ill-appearing.  Cardiovascular:     Rate and Rhythm: Normal rate.  Pulmonary:     Effort: Pulmonary effort is normal. No respiratory distress.  Abdominal:     General: Abdomen is flat. There is no distension.  Musculoskeletal:        General: Normal range of motion.     Cervical back: Normal range of motion.  Neurological:     General: No focal deficit present.     Mental Status: She is alert and oriented to person, place, and time.         I have independently reviewed the above radiology studies  and reviewed the findings with the patient.   Recent Lab Findings: Lab Results  Component Value Date   WBC 7.3 04/17/2021   HGB 13.2 04/17/2021   HCT 40.9 04/17/2021   PLT 239 04/17/2021   GLUCOSE 151 (H) 04/17/2021   CHOL 270 (H) 07/18/2010   TRIG 93.0 07/18/2010   HDL 60.90 07/18/2010   LDLDIRECT 194.7 07/18/2010   ALT 16 04/17/2021   AST 20 04/17/2021   NA 138 04/17/2021   K 3.7 04/17/2021  CL 102 04/17/2021   CREATININE 0.77 04/17/2021   BUN 16 04/17/2021   CO2 27  04/17/2021   TSH 2.74 07/18/2010   INR 1.0 10/01/2011      Assessment / Plan:   75 year old female with paraesophageal hernia.  She has been admitted recently to the emergency department with chest pain.  I think this is all been due to her paraesophageal hernia.  I personally reviewed her cross-sectional imaging a good portion of her stomach is folded in left chest.  I am concerned that she may be having intermittent incarcerations and possible mild torsion of her stomach with meals.  Given that this has been occurring for several years I do not think that she is at risk of volvulus however I have recommended that we proceed with an EGD and robotic assisted paraesophageal hernia repair with fundoplication as soon as possible.  We will look for dates in the next week to accomplish this.  If she has any symptoms prior to then that do not resolve that she has been instructed to present to the emergency department and call us directly.     I  spent 40 minutes with the patient face to face counseling and coordination of care.    Lajuana Matte 05/12/2021 3:37 PM

## 2021-05-11 NOTE — Progress Notes (Signed)
[address]       [place] [id number]             [id number]                    Trease L Petersen [facility] Medical Record #[id number] Date of Birth: [birth year]  Referring: Merrily Pew, MD Primary Care: Maurice Small, MD (Inactive) Primary Cardiologist: None  Chief Complaint:    Chief Complaint  Patient presents with   Hiatal Hernia    Surgical consult, ED CT Chest 05/08/21, CT ABD/Pelvis 04/18/21    History of Present Illness:    Dorothy Stone 75 y.o. female presents to discuss surgical management of the paraesophageal hernia.  The patient states that over the last several months she has had worsening right costal and rib pain every time that she eats.  This mostly occurs if she has too much to eat this has been decreasing the size of her meals which has been effective.  She originally presented to the emergency department was noted to have a kidney stone but also found to have this paraesophageal hernia.  When she thinks back there is been several years of her having this pain which is associated with large meals.  She also has a remote history of reflux and has at one point been on regular Tums medication with symptomatic relief.  She has not been on any antireflux medication in several years.  She has lost about 11 pounds over the last 3 months.  She also has a dry cough regularly.    Zubrod Score: At the time of surgery this patient's most appropriate activity status/level should be described as: [x]     0    Normal activity, no symptoms []     1    Restricted in physical strenuous activity but ambulatory, able to do out light work []     2    Ambulatory and capable of self care, unable to do work activities, up and about               >50 % of waking hours                              []     3    Only limited self care, in bed greater than 50% of waking hours []     4    Completely disabled, no self care, confined to bed or chair []     5    Moribund   Past  Medical History:  Diagnosis Date   Cancer ([place])    basal cell carcinoma   History of kidney stones    Hyperlipidemia     Past Surgical History:  Procedure Laterality Date   BASAL CELL CARCINOMA EXCISION  2018   left leg below knee   CYSTOSCOPY/URETEROSCOPY/HOLMIUM LASER/STENT PLACEMENT Right 12/27/2020   Procedure: CYSTOSCOPY/URETEROSCOPY/HOLMIUM LASER/STENT PLACEMENT,WASHINGS AND  BIOPSY;  Surgeon: Robley Fries, MD;  Location: [facility];  Service: Urology;  Laterality: Right;   EYE SURGERY Bilateral 2019   cataracts removed with lens impants    Family History  Problem Relation Age of Onset   Arthritis Other    Hyperlipidemia Father    Ovarian cancer Mother    Colon polyps Father    Colon cancer Paternal Grandmother    Stroke Mother    GI problems Mother    CAD Mother  Heart block Brother    CAD Brother      Social History   Tobacco Use  Smoking Status Never  Smokeless Tobacco Never    Social History   Substance and Sexual Activity  Alcohol Use No     No Known Allergies  Current Outpatient Medications  Medication Sig Dispense Refill   co-enzyme Q-10 30 MG capsule Take 30 mg by mouth 2 (two) times daily.     COCONUT OIL PO Take by mouth as needed. Takes very small amount in teaspoon prn     Fish Oil-Cholecalciferol (OMEGA-3 + VITAMIN D3 PO) Take by mouth.     OLIVE LEAF EXTRACT PO Take by mouth 2 (two) times daily.     OVER THE COUNTER MEDICATION Ultimate mega vitamin d 3 daily     pantoprazole (PROTONIX) 20 MG tablet Take 1 tablet (20 mg total) by mouth daily. 30 tablet 1   Probiotic Product (PROBIOTIC FORMULA) CAPS Take 2 capsules by mouth daily.     sucralfate (CARAFATE) 1 GM/10ML suspension Take 10 mLs (1 g total) by mouth 4 (four) times daily -  with meals and at bedtime. 420 mL 0   tamsulosin (FLOMAX) 0.4 MG CAPS capsule Take 1 capsule (0.4 mg total) by mouth daily. 30 capsule 0   oxybutynin (DITROPAN) 5 MG tablet Take 1 tablet (5  mg total) by mouth every 8 (eight) hours as needed for bladder spasms. (Patient not taking: Reported on 05/12/2021) 30 tablet 0   traMADol (ULTRAM) 50 MG tablet Take 1 tablet (50 mg total) by mouth every 6 (six) hours as needed. (Patient not taking: Reported on 05/12/2021) 15 tablet 0   No current facility-administered medications for this visit.    Review of Systems  Constitutional:  Positive for weight loss.  Respiratory:  Negative for shortness of breath.   Cardiovascular:  Positive for chest pain.  Gastrointestinal:  Positive for abdominal pain and heartburn.    PHYSICAL EXAMINATION: BP 120/78 (BP Location: Left Arm, Patient Position: Sitting)   Pulse 86   Resp 20   Ht 5\' 2"  (1.575 m)   Wt 149 lb (67.6 kg)   SpO2 97% Comment: RA  BMI 27.25 kg/m  Physical Exam Constitutional:      General: She is not in acute distress.    Appearance: Normal appearance. She is normal weight. She is not ill-appearing.  Cardiovascular:     Rate and Rhythm: Normal rate.  Pulmonary:     Effort: Pulmonary effort is normal. No respiratory distress.  Abdominal:     General: Abdomen is flat. There is no distension.  Musculoskeletal:        General: Normal range of motion.     Cervical back: Normal range of motion.  Neurological:     General: No focal deficit present.     Mental Status: She is alert and oriented to person, place, and time.         I have independently reviewed the above radiology studies  and reviewed the findings with the patient.   Recent Lab Findings: Lab Results  Component Value Date   WBC 7.3 04/17/2021   HGB 13.2 04/17/2021   HCT 40.9 04/17/2021   PLT 239 04/17/2021   GLUCOSE 151 (H) 04/17/2021   CHOL 270 (H) 07/18/2010   TRIG 93.0 07/18/2010   HDL 60.90 07/18/2010   LDLDIRECT 194.7 07/18/2010   ALT 16 04/17/2021   AST 20 04/17/2021   NA 138 04/17/2021   K 3.7 04/17/2021  CL 102 04/17/2021   CREATININE 0.77 04/17/2021   BUN 16 04/17/2021   CO2 27  04/17/2021   TSH 2.74 07/18/2010   INR 1.0 10/01/2011      Assessment / Plan:   75 year old female with paraesophageal hernia.  She has been admitted recently to the emergency department with chest pain.  I think this is all been due to her paraesophageal hernia.  I personally reviewed her cross-sectional imaging a good portion of her stomach is folded in left chest.  I am concerned that she may be having intermittent incarcerations and possible mild torsion of her stomach with meals.  Given that this has been occurring for several years I do not think that she is at risk of volvulus however I have recommended that we proceed with an EGD and robotic assisted paraesophageal hernia repair with fundoplication as soon as possible.  We will look for dates in the next week to accomplish this.  If she has any symptoms prior to then that do not resolve that she has been instructed to present to the emergency department and call us directly.     I  spent 40 minutes with the patient face to face counseling and coordination of care.    Lajuana Matte 05/12/2021 3:37 PM

## 2021-05-12 ENCOUNTER — Encounter: Payer: Self-pay | Admitting: *Deleted

## 2021-05-12 ENCOUNTER — Other Ambulatory Visit: Payer: Self-pay | Admitting: *Deleted

## 2021-05-12 ENCOUNTER — Institutional Professional Consult (permissible substitution): Payer: Medicare Other | Admitting: Thoracic Surgery (Cardiothoracic Vascular Surgery)

## 2021-05-12 ENCOUNTER — Other Ambulatory Visit: Payer: Self-pay

## 2021-05-12 VITALS — BP 120/78 | HR 86 | Resp 20 | Ht 62.0 in | Wt 149.0 lb

## 2021-05-12 DIAGNOSIS — K449 Diaphragmatic hernia without obstruction or gangrene: Secondary | ICD-10-CM

## 2021-05-15 ENCOUNTER — Ambulatory Visit
Admission: RE | Admit: 2021-05-15 | Discharge: 2021-05-15 | Disposition: A | Discharge status: Home / Self Care | Payer: Medicare Other | Source: Ambulatory Visit | Attending: Family Medicine | Admitting: Family Medicine

## 2021-05-15 DIAGNOSIS — E041 Nontoxic single thyroid nodule: Secondary | ICD-10-CM

## 2021-05-15 DIAGNOSIS — E042 Nontoxic multinodular goiter: Secondary | ICD-10-CM | POA: Diagnosis not present

## 2021-05-16 NOTE — Pre-Procedure Instructions (Signed)
Surgical Instructions    Your procedure is scheduled on Friday 05/19/21.   Report to [facility] Main Entrance "A" at 05:30 A.M., then check in with the Admitting office.  Call this number if you have problems the morning of surgery:  [phone]   If you have any questions prior to your surgery date call [phone]: Open Monday-Friday 8am-4pm    Remember:  Do not eat or drink after midnight the night before your surgery    Take these medicines the morning of surgery with A SIP OF WATER   pantoprazole (PROTONIX)    As of today, STOP taking any Aspirin (unless otherwise instructed by your surgeon) Aleve, Naproxen, Ibuprofen, Motrin, Advil, Goody's, BC's, all herbal medications, fish oil, and all vitamins.     After your COVID test   You are not required to quarantine however you are required to wear a well-fitting mask when you are out and around people not in your household.  If your mask becomes wet or soiled, replace with a new one.  Wash your hands often with soap and water for 20 seconds or clean your hands with an alcohol-based hand sanitizer that contains at least 60% alcohol.  Do not share personal items.  Notify your provider: if you are in close contact with someone who has COVID  or if you develop a fever of 100.4 or greater, sneezing, cough, sore throat, shortness of breath or body aches.             Do not wear jewelry or makeup Do not wear lotions, powders, perfumes/colognes, or deodorant. Do not shave 48 hours prior to surgery.  Men may shave face and neck. Do not bring valuables to the hospital. DO Not wear nail polish, gel polish, artificial nails, or any other type of covering on natural nails including finger and toenails. If patients have artificial nails, gel coating, etc. that need to be removed by a nail salon, please have this removed prior to surgery or surgery may need to be canceled/delayed if the surgeon/ anesthesia feels like the patient is  unable to be adequately monitored.             [facility] is not responsible for any belongings or valuables.  Do NOT Smoke (Tobacco/Vaping)  24 hours prior to your procedure  If you use a CPAP at night, you may bring your mask for your overnight stay.   Contacts, glasses, hearing aids, dentures or partials may not be worn into surgery, please bring cases for these belongings   For patients admitted to the hospital, discharge time will be determined by your treatment team.   Patients discharged the day of surgery will not be allowed to drive home, and someone needs to stay with them for 24 hours.  NO VISITORS WILL BE ALLOWED IN PRE-OP WHERE PATIENTS ARE PREPPED FOR SURGERY.  ONLY 1 SUPPORT PERSON MAY BE PRESENT IN THE WAITING ROOM WHILE YOU ARE IN SURGERY.  IF YOU ARE TO BE ADMITTED, ONCE YOU ARE IN YOUR ROOM YOU WILL BE ALLOWED TWO (2) VISITORS. 1 (ONE) VISITOR MAY STAY OVERNIGHT BUT MUST ARRIVE TO THE ROOM BY 8pm.  Minor children may have two parents present. Special consideration for safety and communication needs will be reviewed on a case by case basis.  Special instructions:    Oral Hygiene is also important to reduce your risk of infection.  Remember - BRUSH YOUR TEETH THE MORNING OF SURGERY WITH YOUR REGULAR TOOTHPASTE   [facility]- Preparing For Surgery  Before surgery, you can play an important role. Because skin is not sterile, your skin needs to be as free of germs as possible. You can reduce the number of germs on your skin by washing with CHG (chlorahexidine gluconate) Soap before surgery.  CHG is an antiseptic cleaner which kills germs and bonds with the skin to continue killing germs even after washing.     Please do not use if you have an allergy to CHG or antibacterial soaps. If your skin becomes reddened/irritated stop using the CHG.  Do not shave (including legs and underarms) for at least 48 hours prior to first CHG shower. It is OK to shave your face.  Please  follow these instructions carefully.     Shower the NIGHT BEFORE SURGERY and the MORNING OF SURGERY with CHG Soap.   If you chose to wash your hair, wash your hair first as usual with your normal shampoo. After you shampoo, rinse your hair and body thoroughly to remove the shampoo.  Then [employer] and genitals (private parts) with your normal soap and rinse thoroughly to remove soap.  After that Use CHG Soap as you would any other liquid soap. You can apply CHG directly to the skin and wash gently with a scrungie or a clean washcloth.   Apply the CHG Soap to your body ONLY FROM THE NECK DOWN.  Do not use on open wounds or open sores. Avoid contact with your eyes, ears, mouth and genitals (private parts). Wash Face and genitals (private parts)  with your normal soap.   Wash thoroughly, paying special attention to the area where your surgery will be performed.  Thoroughly rinse your body with warm water from the neck down.  DO NOT shower/wash with your normal soap after using and rinsing off the CHG Soap.  Pat yourself dry with a CLEAN TOWEL.  Wear CLEAN PAJAMAS to bed the night before surgery  Place CLEAN SHEETS on your bed the night before your surgery  DO NOT SLEEP WITH PETS.   Day of Surgery:  Take a shower with CHG soap. Wear Clean/Comfortable clothing the morning of surgery Do not apply any deodorants/lotions.   Remember to brush your teeth WITH YOUR REGULAR TOOTHPASTE.   Please read over the following fact sheets that you were given.

## 2021-05-17 ENCOUNTER — Other Ambulatory Visit: Payer: Self-pay

## 2021-05-17 ENCOUNTER — Encounter (HOSPITAL_COMMUNITY)
Admission: RE | Admit: 2021-05-17 | Discharge: 2021-05-17 | Disposition: A | Discharge status: Home / Self Care | Payer: Medicare Other | Source: Ambulatory Visit | Attending: Thoracic Surgery (Cardiothoracic Vascular Surgery) | Admitting: Thoracic Surgery (Cardiothoracic Vascular Surgery)

## 2021-05-17 ENCOUNTER — Encounter (HOSPITAL_COMMUNITY): Payer: Self-pay

## 2021-05-17 VITALS — BP 133/89 | HR 87 | Temp 98.4°F | Resp 18 | Ht 62.0 in | Wt 150.5 lb

## 2021-05-17 DIAGNOSIS — K449 Diaphragmatic hernia without obstruction or gangrene: Secondary | ICD-10-CM | POA: Insufficient documentation

## 2021-05-17 DIAGNOSIS — Z01818 Encounter for other preprocedural examination: Secondary | ICD-10-CM | POA: Diagnosis not present

## 2021-05-17 DIAGNOSIS — Z20822 Contact with and (suspected) exposure to covid-19: Secondary | ICD-10-CM | POA: Diagnosis not present

## 2021-05-17 DIAGNOSIS — J9811 Atelectasis: Secondary | ICD-10-CM | POA: Diagnosis not present

## 2021-05-17 LAB — BLOOD GAS, ARTERIAL
Acid-base deficit: 0.4 mmol/L (ref 0.0–2.0)
Bicarbonate: 23.6 mmol/L (ref 20.0–28.0)
FIO2: 21
O2 Saturation: 96.9 %
Patient temperature: 37
pCO2 arterial: 38.3 mmHg (ref 32.0–48.0)
pH, Arterial: 7.407 (ref 7.350–7.450)
pO2, Arterial: 90.2 mmHg (ref 83.0–108.0)

## 2021-05-17 LAB — CBC
HCT: 41.2 % (ref 36.0–46.0)
Hemoglobin: 13.6 g/dL (ref 12.0–15.0)
MCH: 30.4 pg (ref 26.0–34.0)
MCHC: 33 g/dL (ref 30.0–36.0)
MCV: 92.2 fL (ref 80.0–100.0)
Platelets: 217 10*3/uL (ref 150–400)
RBC: 4.47 MIL/uL (ref 3.87–5.11)
RDW: 12.4 % (ref 11.5–15.5)
WBC: 5.2 10*3/uL (ref 4.0–10.5)
nRBC: 0 % (ref 0.0–0.2)

## 2021-05-17 LAB — COMPREHENSIVE METABOLIC PANEL
ALT: 17 U/L (ref 0–44)
AST: 23 U/L (ref 15–41)
Albumin: 3.8 g/dL (ref 3.5–5.0)
Alkaline Phosphatase: 67 U/L (ref 38–126)
Anion gap: 10 (ref 5–15)
BUN: 15 mg/dL (ref 8–23)
CO2: 22 mmol/L (ref 22–32)
Calcium: 9.2 mg/dL (ref 8.9–10.3)
Chloride: 106 mmol/L (ref 98–111)
Creatinine, Ser: 0.66 mg/dL (ref 0.44–1.00)
GFR, Estimated: >60 mL/min (ref >60)
Glucose, Bld: 75 mg/dL (ref 70–99)
Potassium: 4 mmol/L (ref 3.5–5.1)
Sodium: 138 mmol/L (ref 135–145)
Total Bilirubin: 1.2 mg/dL (ref 0.3–1.2)
Total Protein: 6.4 g/dL — ABNORMAL LOW (ref 6.5–8.1)

## 2021-05-17 LAB — URINALYSIS, ROUTINE W REFLEX MICROSCOPIC
Bilirubin Urine: NEGATIVE
Glucose, UA: NEGATIVE mg/dL
Hgb urine dipstick: NEGATIVE
Ketones, ur: NEGATIVE mg/dL
Leukocytes,Ua: NEGATIVE
Nitrite: NEGATIVE
Protein, ur: NEGATIVE mg/dL
Specific Gravity, Urine: 1.004 — ABNORMAL LOW (ref 1.005–1.030)
pH: 5 (ref 5.0–8.0)

## 2021-05-17 LAB — TYPE AND SCREEN
ABO/RH(D): O POS
Antibody Screen: NEGATIVE

## 2021-05-17 LAB — SURGICAL PCR SCREEN
MRSA, PCR: NEGATIVE
Staphylococcus aureus: NEGATIVE

## 2021-05-17 LAB — SARS CORONAVIRUS 2 (TAT 6-24 HRS): SARS Coronavirus 2: NEGATIVE

## 2021-05-17 LAB — PROTIME-INR
INR: 1 (ref 0.8–1.2)
Prothrombin Time: 13.1 seconds (ref 11.4–15.2)

## 2021-05-17 LAB — APTT: aPTT: 27 seconds (ref 24–36)

## 2021-05-17 NOTE — Progress Notes (Signed)
PCP - Dr. Gloriann Loan Cardiologist - denies  PPM/ICD - n/a Device Orders - n/a Rep Notified - n/a  Chest x-ray - 05/17/21 EKG - 05/17/21 Stress Test - 07/20/15 ECHO - denies Cardiac Cath - denies  Sleep Study - denies CPAP - n/a  Fasting Blood Sugar - n/a Checks Blood Sugar _____ times a day- n/a  Blood Thinner Instructions: n/a Aspirin Instructions: n/a  ERAS Protcol - No PRE-SURGERY Ensure or G2- n/a  COVID TEST- 05/17/21. Pending.    Anesthesia review: Yes. EKG Review.   Patient denies shortness of breath, fever, cough and chest pain at PAT appointment   All instructions explained to the patient, with a verbal understanding of the material. Patient agrees to go over the instructions while at home for a better understanding. Patient also instructed to self quarantine after being tested for COVID-19. The opportunity to ask questions was provided.

## 2021-05-19 ENCOUNTER — Encounter (HOSPITAL_COMMUNITY)
Admission: AD | Disposition: A | Discharge status: Home / Self Care | Payer: Self-pay | Source: Home / Self Care | Attending: Thoracic Surgery (Cardiothoracic Vascular Surgery)

## 2021-05-19 ENCOUNTER — Encounter (HOSPITAL_COMMUNITY): Payer: Self-pay | Admitting: Thoracic Surgery (Cardiothoracic Vascular Surgery)

## 2021-05-19 ENCOUNTER — Other Ambulatory Visit: Payer: Self-pay

## 2021-05-19 ENCOUNTER — Ambulatory Visit (HOSPITAL_COMMUNITY): Payer: Medicare Other | Admitting: Physician Assistant

## 2021-05-19 ENCOUNTER — Observation Stay (HOSPITAL_COMMUNITY)
Admission: AD | Admit: 2021-05-19 | Discharge: 2021-05-21 | Disposition: A | Discharge status: Home / Self Care | Payer: Medicare Other | Attending: Thoracic Surgery (Cardiothoracic Vascular Surgery) | Admitting: Thoracic Surgery (Cardiothoracic Vascular Surgery)

## 2021-05-19 ENCOUNTER — Ambulatory Visit (HOSPITAL_COMMUNITY): Payer: Medicare Other | Admitting: Anesthesiology

## 2021-05-19 ENCOUNTER — Inpatient Hospital Stay (HOSPITAL_COMMUNITY): Payer: Medicare Other

## 2021-05-19 DIAGNOSIS — K449 Diaphragmatic hernia without obstruction or gangrene: Secondary | ICD-10-CM | POA: Diagnosis not present

## 2021-05-19 DIAGNOSIS — J9811 Atelectasis: Secondary | ICD-10-CM | POA: Diagnosis not present

## 2021-05-19 DIAGNOSIS — Z87442 Personal history of urinary calculi: Secondary | ICD-10-CM | POA: Diagnosis not present

## 2021-05-19 DIAGNOSIS — Z79899 Other long term (current) drug therapy: Secondary | ICD-10-CM | POA: Diagnosis not present

## 2021-05-19 DIAGNOSIS — J939 Pneumothorax, unspecified: Secondary | ICD-10-CM | POA: Diagnosis not present

## 2021-05-19 DIAGNOSIS — K439 Ventral hernia without obstruction or gangrene: Secondary | ICD-10-CM | POA: Insufficient documentation

## 2021-05-19 DIAGNOSIS — Z85828 Personal history of other malignant neoplasm of skin: Secondary | ICD-10-CM | POA: Diagnosis not present

## 2021-05-19 DIAGNOSIS — E785 Hyperlipidemia, unspecified: Secondary | ICD-10-CM | POA: Diagnosis not present

## 2021-05-19 DIAGNOSIS — J439 Emphysema, unspecified: Secondary | ICD-10-CM | POA: Diagnosis not present

## 2021-05-19 DIAGNOSIS — K9189 Other postprocedural complications and disorders of digestive system: Secondary | ICD-10-CM

## 2021-05-19 HISTORY — PX: XI ROBOTIC ASSISTED PARAESOPHAGEAL HERNIA REPAIR: SHX6871

## 2021-05-19 HISTORY — PX: VENTRAL HERNIA REPAIR: SHX424

## 2021-05-19 HISTORY — PX: ESOPHAGOGASTRODUODENOSCOPY: SHX5428

## 2021-05-19 LAB — ABO/RH: ABO/RH(D): O POS

## 2021-05-19 SURGERY — EGD (ESOPHAGOGASTRODUODENOSCOPY)
Anesthesia: General | Site: Chest

## 2021-05-19 MED ORDER — FENTANYL CITRATE (PF) 100 MCG/2ML IJ SOLN
INTRAMUSCULAR | Status: DC | PRN
  Administered 2021-05-19: 50 ug via INTRAVENOUS
  Administered 2021-05-19: 25 ug via INTRAVENOUS
  Administered 2021-05-19: 100 ug via INTRAVENOUS

## 2021-05-19 MED ORDER — CHLORHEXIDINE GLUCONATE 0.12 % MT SOLN: 15.0000 mL | Freq: Once | OROMUCOSAL | Status: AC

## 2021-05-19 MED ORDER — EPHEDRINE SULFATE-NACL 50-0.9 MG/10ML-% IV SOSY
PREFILLED_SYRINGE | INTRAVENOUS | Status: DC | PRN
  Administered 2021-05-19: 5 mg via INTRAVENOUS

## 2021-05-19 MED ORDER — 0.9 % SODIUM CHLORIDE (POUR BTL) OPTIME
TOPICAL | Status: DC | PRN
  Administered 2021-05-19: 2000 mL

## 2021-05-19 MED ORDER — ACETAMINOPHEN 500 MG PO TABS
ORAL_TABLET | ORAL | Status: AC
  Administered 2021-05-19: 1000 mg
  Filled 2021-05-19: qty 2

## 2021-05-19 MED ORDER — BISACODYL 5 MG PO TBEC
10.0000 mg | DELAYED_RELEASE_TABLET | Freq: Every day | ORAL | Status: DC
  Administered 2021-05-21: 10 mg via ORAL
  Filled 2021-05-19: qty 2

## 2021-05-19 MED ORDER — ORAL CARE MOUTH RINSE: 15.0000 mL | Freq: Once | OROMUCOSAL | Status: AC

## 2021-05-19 MED ORDER — FENTANYL CITRATE (PF) 100 MCG/2ML IJ SOLN
INTRAMUSCULAR | Status: AC
  Filled 2021-05-19: qty 2

## 2021-05-19 MED ORDER — BUPIVACAINE LIPOSOME 1.3 % IJ SUSP
INTRAMUSCULAR | Status: DC | PRN
  Administered 2021-05-19: 50 mL

## 2021-05-19 MED ORDER — ACETAMINOPHEN 500 MG PO TABS
1000.0000 mg | ORAL_TABLET | Freq: Four times a day (QID) | ORAL | Status: DC
  Administered 2021-05-20 – 2021-05-21 (×3): 1000 mg via ORAL
  Filled 2021-05-19 (×3): qty 2

## 2021-05-19 MED ORDER — HEMOSTATIC AGENTS (NO CHARGE) OPTIME
TOPICAL | Status: DC | PRN
  Administered 2021-05-19: 1 via TOPICAL

## 2021-05-19 MED ORDER — ROCURONIUM BROMIDE 10 MG/ML (PF) SYRINGE
PREFILLED_SYRINGE | INTRAVENOUS | Status: DC | PRN
  Administered 2021-05-19: 20 mg via INTRAVENOUS
  Administered 2021-05-19: 50 mg via INTRAVENOUS
  Administered 2021-05-19: 30 mg via INTRAVENOUS

## 2021-05-19 MED ORDER — ONDANSETRON HCL 4 MG/2ML IJ SOLN
4.0000 mg | Freq: Four times a day (QID) | INTRAMUSCULAR | Status: DC
  Administered 2021-05-19 – 2021-05-21 (×8): 4 mg via INTRAVENOUS
  Filled 2021-05-19 (×8): qty 2

## 2021-05-19 MED ORDER — ACETAMINOPHEN 10 MG/ML IV SOLN
1000.0000 mg | Freq: Once | INTRAVENOUS | Status: DC | PRN
  Administered 2021-05-19: 1000 mg via INTRAVENOUS

## 2021-05-19 MED ORDER — CEFAZOLIN SODIUM-DEXTROSE 2-4 GM/100ML-% IV SOLN
2.0000 g | INTRAVENOUS | Status: AC
  Administered 2021-05-19: 2 g via INTRAVENOUS

## 2021-05-19 MED ORDER — DEXAMETHASONE SODIUM PHOSPHATE 10 MG/ML IJ SOLN
INTRAMUSCULAR | Status: DC | PRN
  Administered 2021-05-19: 10 mg via INTRAVENOUS

## 2021-05-19 MED ORDER — LIDOCAINE 2% (20 MG/ML) 5 ML SYRINGE
INTRAMUSCULAR | Status: DC | PRN
  Administered 2021-05-19: 40 mg via INTRAVENOUS

## 2021-05-19 MED ORDER — LACTATED RINGERS IV SOLN: INTRAVENOUS | Status: DC

## 2021-05-19 MED ORDER — PHENYLEPHRINE 40 MCG/ML (10ML) SYRINGE FOR IV PUSH (FOR BLOOD PRESSURE SUPPORT)
PREFILLED_SYRINGE | INTRAVENOUS | Status: DC | PRN
  Administered 2021-05-19: 80 ug via INTRAVENOUS

## 2021-05-19 MED ORDER — FENTANYL CITRATE (PF) 250 MCG/5ML IJ SOLN
INTRAMUSCULAR | Status: AC
  Filled 2021-05-19: qty 5

## 2021-05-19 MED ORDER — ONDANSETRON HCL 4 MG/2ML IJ SOLN
INTRAMUSCULAR | Status: DC | PRN
  Administered 2021-05-19: 4 mg via INTRAVENOUS

## 2021-05-19 MED ORDER — LACTATED RINGERS IV SOLN: INTRAVENOUS | Status: DC | PRN

## 2021-05-19 MED ORDER — BUPIVACAINE LIPOSOME 1.3 % IJ SUSP
INTRAMUSCULAR | Status: AC
  Filled 2021-05-19: qty 20

## 2021-05-19 MED ORDER — BUPIVACAINE HCL (PF) 0.5 % IJ SOLN
INTRAMUSCULAR | Status: AC
  Filled 2021-05-19: qty 30

## 2021-05-19 MED ORDER — PROPOFOL 10 MG/ML IV BOLUS
INTRAVENOUS | Status: DC | PRN
  Administered 2021-05-19: 50 mg via INTRAVENOUS
  Administered 2021-05-19: 100 mg via INTRAVENOUS

## 2021-05-19 MED ORDER — ENOXAPARIN SODIUM 40 MG/0.4ML IJ SOSY
40.0000 mg | PREFILLED_SYRINGE | Freq: Every day | INTRAMUSCULAR | Status: DC
  Administered 2021-05-20 – 2021-05-21 (×2): 40 mg via SUBCUTANEOUS
  Filled 2021-05-19 (×2): qty 0.4

## 2021-05-19 MED ORDER — KETOROLAC TROMETHAMINE 15 MG/ML IJ SOLN
INTRAMUSCULAR | Status: AC
  Filled 2021-05-19: qty 1

## 2021-05-19 MED ORDER — PROPOFOL 10 MG/ML IV BOLUS
INTRAVENOUS | Status: AC
  Filled 2021-05-19: qty 20

## 2021-05-19 MED ORDER — PANTOPRAZOLE SODIUM 40 MG IV SOLR
40.0000 mg | INTRAVENOUS | Status: DC
  Administered 2021-05-19 – 2021-05-21 (×3): 40 mg via INTRAVENOUS
  Filled 2021-05-19 (×3): qty 40

## 2021-05-19 MED ORDER — CEFAZOLIN SODIUM-DEXTROSE 2-4 GM/100ML-% IV SOLN
INTRAVENOUS | Status: AC
  Filled 2021-05-19: qty 100

## 2021-05-19 MED ORDER — DEXMEDETOMIDINE (PRECEDEX) IN NS 20 MCG/5ML (4 MCG/ML) IV SYRINGE
PREFILLED_SYRINGE | INTRAVENOUS | Status: DC | PRN
  Administered 2021-05-19: 8 ug via INTRAVENOUS

## 2021-05-19 MED ORDER — CEFAZOLIN SODIUM-DEXTROSE 2-4 GM/100ML-% IV SOLN
2.0000 g | Freq: Three times a day (TID) | INTRAVENOUS | Status: AC
  Administered 2021-05-19 – 2021-05-20 (×2): 2 g via INTRAVENOUS
  Filled 2021-05-19 (×2): qty 100

## 2021-05-19 MED ORDER — AMISULPRIDE (ANTIEMETIC) 5 MG/2ML IV SOLN: 10.0000 mg | Freq: Once | INTRAVENOUS | Status: DC | PRN

## 2021-05-19 MED ORDER — KETOROLAC TROMETHAMINE 30 MG/ML IJ SOLN
30.0000 mg | Freq: Four times a day (QID) | INTRAMUSCULAR | Status: DC
  Administered 2021-05-20 (×3): 30 mg via INTRAVENOUS
  Filled 2021-05-19 (×3): qty 1

## 2021-05-19 MED ORDER — FENTANYL CITRATE (PF) 100 MCG/2ML IJ SOLN
25.0000 ug | INTRAMUSCULAR | Status: DC | PRN
  Administered 2021-05-19 (×2): 25 ug via INTRAVENOUS
  Administered 2021-05-19: 50 ug via INTRAVENOUS

## 2021-05-19 MED ORDER — PHENYLEPHRINE HCL-NACL 20-0.9 MG/250ML-% IV SOLN
INTRAVENOUS | Status: DC | PRN
Start: 2021-05-19 — End: 2021-05-19
  Administered 2021-05-19: 25 ug/min via INTRAVENOUS

## 2021-05-19 MED ORDER — KETOROLAC TROMETHAMINE 30 MG/ML IJ SOLN
15.0000 mg | Freq: Four times a day (QID) | INTRAMUSCULAR | Status: DC
  Administered 2021-05-19 (×2): 15 mg via INTRAVENOUS
  Filled 2021-05-19: qty 1

## 2021-05-19 MED ORDER — MORPHINE SULFATE (PF) 2 MG/ML IV SOLN
1.0000 mg | INTRAVENOUS | Status: DC | PRN
  Administered 2021-05-19: 1 mg via INTRAVENOUS
  Filled 2021-05-19: qty 1

## 2021-05-19 MED ORDER — ONDANSETRON HCL 4 MG/2ML IJ SOLN: 4.0000 mg | Freq: Once | INTRAMUSCULAR | Status: DC | PRN

## 2021-05-19 MED ORDER — ACETAMINOPHEN 10 MG/ML IV SOLN
INTRAVENOUS | Status: AC
  Filled 2021-05-19: qty 100

## 2021-05-19 MED ORDER — CHLORHEXIDINE GLUCONATE 0.12 % MT SOLN
OROMUCOSAL | Status: AC
  Administered 2021-05-19: 15 mL via OROMUCOSAL
  Filled 2021-05-19: qty 15

## 2021-05-19 MED ORDER — ACETAMINOPHEN 160 MG/5ML PO SOLN
1000.0000 mg | Freq: Four times a day (QID) | ORAL | Status: DC
  Administered 2021-05-20 (×2): 1000 mg via ORAL
  Filled 2021-05-19 (×2): qty 40.6

## 2021-05-19 SURGICAL SUPPLY — 89 items
BLADE CLIPPER SURG (BLADE) ×4 IMPLANT
BLADE SURG 11 STRL SS (BLADE) ×4 IMPLANT
BUTTON OLYMPUS DEFENDO 5 PIECE (MISCELLANEOUS) ×4 IMPLANT
CANISTER SUCT 3000ML PPV (MISCELLANEOUS) ×8 IMPLANT
CANNULA REDUC XI 12-8 STAPL (CANNULA)
CANNULA REDUCER 12-8 DVNC XI (CANNULA) IMPLANT
CATH THORACIC 28FR (CATHETERS) IMPLANT
CNTNR URN SCR LID CUP LEK RST (MISCELLANEOUS) ×12 IMPLANT
CONT SPEC 4OZ STRL OR WHT (MISCELLANEOUS) ×4
COVER BACK TABLE 60X90IN (DRAPES) IMPLANT
DECANTER SPIKE VIAL GLASS SM (MISCELLANEOUS) ×4 IMPLANT
DEFOGGER SCOPE WARMER CLEARIFY (MISCELLANEOUS) ×4 IMPLANT
DERMABOND ADVANCED (GAUZE/BANDAGES/DRESSINGS) ×1
DERMABOND ADVANCED .7 DNX12 (GAUZE/BANDAGES/DRESSINGS) ×3 IMPLANT
DEVICE SUTURE ENDOST 10MM (ENDOMECHANICALS) IMPLANT
DRAIN PENROSE 1/2X12 LTX STRL (WOUND CARE) ×4 IMPLANT
DRAPE ARM DVNC X/XI (DISPOSABLE) ×12 IMPLANT
DRAPE COLUMN DVNC XI (DISPOSABLE) ×3 IMPLANT
DRAPE CV SPLIT W-CLR ANES SCRN (DRAPES) ×4 IMPLANT
DRAPE DA VINCI XI ARM (DISPOSABLE) ×4
DRAPE DA VINCI XI COLUMN (DISPOSABLE) ×2
DRAPE INCISE IOBAN 66X45 STRL (DRAPES) IMPLANT
DRAPE ORTHO SPLIT 77X108 STRL (DRAPES) ×1
DRAPE SURG ORHT 6 SPLT 77X108 (DRAPES) ×3 IMPLANT
ELECT REM PT RETURN 9FT ADLT (ELECTROSURGICAL) ×4
ELECTRODE REM PT RTRN 9FT ADLT (ELECTROSURGICAL) ×3 IMPLANT
FELT TEFLON 1X6 (MISCELLANEOUS) IMPLANT
FORCEPS GRASP COMBO 8X230 (FORCEP) ×4 IMPLANT
GAUZE SPONGE 4X4 12PLY STRL (GAUZE/BANDAGES/DRESSINGS) ×4 IMPLANT
GLOVE SURG ENC MOIS LTX SZ7 (GLOVE) ×4 IMPLANT
GLOVE SURG ENC MOIS LTX SZ7.5 (GLOVE) ×8 IMPLANT
GOWN STRL REUS W/ TWL LRG LVL3 (GOWN DISPOSABLE) ×3 IMPLANT
GOWN STRL REUS W/ TWL XL LVL3 (GOWN DISPOSABLE) ×6 IMPLANT
GOWN STRL REUS W/TWL 2XL LVL3 (GOWN DISPOSABLE) ×4 IMPLANT
GOWN STRL REUS W/TWL LRG LVL3 (GOWN DISPOSABLE) ×1
GOWN STRL REUS W/TWL XL LVL3 (GOWN DISPOSABLE) ×2
GRASPER SUT TROCAR 14GX15 (MISCELLANEOUS) ×4 IMPLANT
HEMOSTAT SURGICEL 2X14 (HEMOSTASIS) ×4 IMPLANT
IV NS 1000ML (IV SOLUTION)
IV NS 1000ML BAXH (IV SOLUTION) IMPLANT
KIT BASIN OR (CUSTOM PROCEDURE TRAY) ×4 IMPLANT
KIT TURNOVER KIT B (KITS) ×4 IMPLANT
LIGASURE VESSEL 5MM BLUNT TIP (ELECTROSURGICAL) ×4 IMPLANT
MARKER SKIN DUAL TIP RULER LAB (MISCELLANEOUS) ×4 IMPLANT
MATRIX GENTRIX 7.5X6 HIATAL (Tissue) ×4 IMPLANT
NEEDLE 18GX1X1/2 (RX/OR ONLY) (NEEDLE) IMPLANT
NEEDLE HYPO 22GX1.5 SAFETY (NEEDLE) ×4 IMPLANT
NEEDLE SCLEROTHERAPY 23X2X240 (NEEDLE) IMPLANT
NS IRRIG 1000ML POUR BTL (IV SOLUTION) ×8 IMPLANT
OIL SILICONE PENTAX (PARTS (SERVICE/REPAIRS)) IMPLANT
PACK CHEST (CUSTOM PROCEDURE TRAY) ×4 IMPLANT
PACK UNIVERSAL I (CUSTOM PROCEDURE TRAY) ×4 IMPLANT
PAD ARMBOARD 7.5X6 YLW CONV (MISCELLANEOUS) ×8 IMPLANT
PORT ACCESS TROCAR AIRSEAL 12 (TROCAR) ×3 IMPLANT
PORT ACCESS TROCAR AIRSEAL 5M (TROCAR) ×1
SEAL CANN UNIV 5-8 DVNC XI (MISCELLANEOUS) ×12 IMPLANT
SEAL XI 5MM-8MM UNIVERSAL (MISCELLANEOUS) ×4
SEALER SYNCHRO 8 IS4000 DV (MISCELLANEOUS) ×1
SEALER SYNCHRO 8 IS4000 DVNC (MISCELLANEOUS) ×3 IMPLANT
SET TRI-LUMEN FLTR TB AIRSEAL (TUBING) ×4 IMPLANT
SHEET MEDIUM DRAPE 40X70 STRL (DRAPES) ×4 IMPLANT
STAPLER CANNULA SEAL DVNC XI (STAPLE) IMPLANT
STAPLER CANNULA SEAL XI (STAPLE)
SUT ETHIBOND 0 36 GRN (SUTURE) ×12 IMPLANT
SUT SILK  1 MH (SUTURE) ×2
SUT SILK 1 MH (SUTURE) ×6 IMPLANT
SUT SURGIDAC NAB ES-9 0 48 120 (SUTURE) ×12 IMPLANT
SUT VIC AB 3-0 SH 27 (SUTURE) ×2
SUT VIC AB 3-0 SH 27X BRD (SUTURE) ×6 IMPLANT
SUT VIC AB 3-0 X1 27 (SUTURE) IMPLANT
SUT VICRYL 0 UR6 27IN ABS (SUTURE) ×12 IMPLANT
SUT VLOC 180 0 6IN GS21 (SUTURE) ×8 IMPLANT
SYR 10ML LL (SYRINGE) IMPLANT
SYR 20CC LL (SYRINGE) ×8 IMPLANT
SYR 20ML ECCENTRIC (SYRINGE) ×4 IMPLANT
SYR 30ML SLIP (SYRINGE) ×4 IMPLANT
SYSTEM RETRIEVAL ANCHOR 8 (MISCELLANEOUS) ×4 IMPLANT
SYSTEM SAHARA CHEST DRAIN ATS (WOUND CARE) IMPLANT
TOWEL GREEN STERILE (TOWEL DISPOSABLE) ×4 IMPLANT
TOWEL GREEN STERILE FF (TOWEL DISPOSABLE) ×4 IMPLANT
TRAY FOLEY MTR SLVR 16FR STAT (SET/KITS/TRAYS/PACK) ×4 IMPLANT
TROCAR PORT AIRSEAL 8X120 (TROCAR) ×4 IMPLANT
TROCAR XCEL 12X100 BLDLESS (ENDOMECHANICALS) ×4 IMPLANT
TROCAR XCEL BLADELESS 5X75MML (TROCAR) ×4 IMPLANT
TROCAR XCEL NON-BLD 5MMX100MML (ENDOMECHANICALS) IMPLANT
TUBE CONNECTING 20X1/4 (TUBING) ×4 IMPLANT
TUBING ENDO SMARTCAP (MISCELLANEOUS) ×4 IMPLANT
UNDERPAD 30X36 HEAVY ABSORB (UNDERPADS AND DIAPERS) ×4 IMPLANT
WATER STERILE IRR 1000ML POUR (IV SOLUTION) ×4 IMPLANT

## 2021-05-19 NOTE — Plan of Care (Signed)

## 2021-05-19 NOTE — Discharge Summary (Signed)
Physician Discharge Summary       [address]       [place] [id number]             [phone]    Patient ID: Dorothy Stone MRN: [id number] DOB/AGE: 1945/08/20 75 y.o.  Admit date: 05/19/2021 Discharge date: 05/21/2021  Admission Diagnoses: Paraesophageal hernia Discharge Diagnoses:  Ventral hernia S/p ESOPHAGOGASTRODUODENOSCOPY (EGD), XI ROBOTIC ASSISTED PARAESOPHAGEAL HERNIA REPAIR WITH FUNDOPLICATION, LAPAROSCOPIC VENTRAL HERNIA  3. History of cancer (basal cell, HCC) 4. History of kidney stones 5. History of hyperlipidemia    Consults: None  Procedure (s):  ESOPHAGOGASTRODUODENOSCOPY (EGD), XI ROBOTIC ASSISTED PARAESOPHAGEAL HERNIA REPAIR WITH FUNDOPLICATION, LAPAROSCOPIC VENTRAL HERNIA by Dr. Kipp Brood on 05/19/2021.  History of Presenting Illness: Dorothy Stone 74 y.o. female presents to discuss surgical management of the paraesophageal hernia.  The patient states that over the last several months she has had worsening right costal and rib pain every time that she eats.  This mostly occurs if she has too much to eat this has been decreasing the size of her meals which has been effective.  She originally presented to the emergency department was noted to have a kidney stone but also found to have this paraesophageal hernia.  When she thinks back there is been several years of her having this pain which is associated with large meals.  She also has a remote history of reflux and has at one point been on regular Tums medication with symptomatic relief.  She has not been on any antireflux medication in several years.  She has lost about 11 pounds over the last 3 months.  She also has a dry cough regularly. She has been admitted recently to the emergency department with chest pain.  IDr.Lightfoot thinks it has all been due to her paraesophageal hernia.  He described the need for robotic assisted para esophageal hernia repair. Potential risks, benefits, and  complications of the surgery were discussed with the patient and she agreed to proceed with surgery.  Brief Hospital Course: The patient is doing well overall.  She has maintained stable hemodynamics.  Oxygen was weaned and she maintains good saturations on room air.  Her esophagram was performed on postop day 1 and showed normal postoperative changes with no evidence of dye extravasation.  She was started on a course of clear liquids and advance to a soft diet which she is tolerating.  He was started on a course of routine postoperative mobilization.  Incisions are healing well without evidence of infection.  At the time of discharge she is felt to be quite stable.    Latest Vital Signs: Blood pressure (!) 114/59, pulse 71, temperature 98.7 F (37.1 C), temperature source Oral, resp. rate 16, height 5\' 2"  (1.575 m), weight 67.1 kg, SpO2 97 %.  Physical Exam: General appearance: alert, cooperative, and no distress Heart: regular rate and rhythm Lungs: clear to auscultation bilaterally Abdomen: benign Extremities: no edema or calf tenderness Wound: incis healing well Discharge Condition:Stable and discharged to home.  Recent laboratory studies:  Lab Results  Component Value Date   WBC 8.8 05/20/2021   HGB 11.8 (L) 05/20/2021   HCT 35.7 (L) 05/20/2021   MCV 91.5 05/20/2021   PLT 176 05/20/2021   Lab Results  Component Value Date   NA 137 05/20/2021   K 4.2 05/20/2021   CL 105 05/20/2021   CO2 24 05/20/2021   CREATININE 0.70 05/20/2021   GLUCOSE 98 05/20/2021  Diagnostic Studies: DG Chest 2 View  Result Date: 05/17/2021 CLINICAL DATA:  Preoperative evaluation for robotic assisted paraesophageal hernia repair with fundoplication on 35/32/99. EXAM: CHEST - 2 VIEW COMPARISON:  CT chest 05/08/2021; X-ray chest 04/17/2021. FINDINGS: Large hiatal hernia. Left basilar atelectasis. No focal consolidation. No pleural effusion or pneumothorax. Heart and mediastinal contours are  unremarkable. No acute osseous abnormality. IMPRESSION: No active cardiopulmonary disease. Large hiatal hernia. Electronically Signed   By: Kathreen Devoid M.D.   On: 05/17/2021 12:48   CT CHEST WO CONTRAST  Result Date: 05/08/2021 CLINICAL DATA:  Large hiatal hernia for 1 month. Pre-surgery workup. History of basal cell carcinoma. EXAM: CT CHEST WITHOUT CONTRAST TECHNIQUE: Multidetector CT imaging of the chest was performed following the standard protocol without IV contrast. COMPARISON:  Chest x-ray 04/17/2021. FINDINGS: Cardiovascular: The heart and aorta are normal in size. There is a small pericardial effusion. Mediastinum/Nodes: There are bilateral hypodense thyroid nodules. The largest nodule measures 1.6 cm. Esophagus is within normal limits. There are no enlarged mediastinal or hilar lymph nodes identified. There is a large hiatal hernia with intrathoracic stomach. Lungs/Pleura: There is some pleuroparenchymal scarring in the lung apices. There is a 2 mm nodule in the left upper lobe image 3/41. There is minimal compressive atelectasis in the left lower lobe secondary to large hiatal hernia. Lungs are otherwise clear. There is no pleural effusion or pneumothorax. With right Upper Abdomen: No acute abnormality. There is a small cyst in the left kidney. Colonic diverticulosis is present. Musculoskeletal: No chest wall mass or suspicious bone lesions identified. IMPRESSION: 1. Large hiatal hernia with intrathoracic stomach. 2. Small pericardial effusion. 3. 1.6 cm incidental thyroid nodule. Recommend thyroid US. Reference: J Am Coll Radiol. 2015 Feb;12(2): 143-50 4. 2 mm left upper lobe nodule. No follow-up needed if patient is low-risk. Non-contrast chest CT can be considered in 12 months if patient is high-risk. This recommendation follows the consensus statement: Guidelines for Management of Incidental Pulmonary Nodules Detected on CT Images: From the Fleischner Society 2017; Radiology 2017; 284:228-243.  Electronically Signed   By: Ronney Asters M.D.   On: 05/08/2021 22:24   DG Chest Port 1 View  Result Date: 05/19/2021 CLINICAL DATA:  Paraesophageal hernia repair. EXAM: PORTABLE CHEST 1 VIEW COMPARISON:  Chest x-ray dated May 17, 2021. FINDINGS: Normal heart size. Persistent extrapulmonary air at the left lung base behind the heart may reflect postsurgical pneumomediastinum. New trace right apical pneumothorax, estimated at 5%. No left pneumothorax. Mild left basilar atelectasis. No consolidation or large pleural effusion. Subcutaneous emphysema in the left chest wall and lower neck. No acute osseous abnormality. IMPRESSION: 1. New trace right apical pneumothorax, estimated at 5%. 2. Persistent extrapulmonary air at the left lung base behind the heart may reflect postsurgical pneumomediastinum. Electronically Signed   By: Titus Dubin M.D.   On: 05/19/2021 14:04   US THYROID  Result Date: 05/15/2021 CLINICAL DATA:  Incidental on CT. EXAM: THYROID ULTRASOUND TECHNIQUE: Ultrasound examination of the thyroid gland and adjacent soft tissues was performed. COMPARISON:  None. FINDINGS: Parenchymal Echotexture: Moderately heterogenous Isthmus: 0.3 cm Right lobe: 4.6 x 1.9 x 1.6 cm Left lobe: 4.2 x 1.7 x 1.4 cm _________________________________________________________ Estimated total number of nodules >/= 1 cm: 4 Number of spongiform nodules >/=  2 cm not described below (TR1): 0 Number of mixed cystic and solid nodules >/= 1.5 cm not described below (TR2): 0 _________________________________________________________ Numerous isoechoic solid nodules in the right mid and lower gland. At least 4  individual nodules are identified and measured. However, none are larger than 1.2 cm and therefore do not meet size criteria to warrant further evaluation. Nodule # 6: Location: Left; Mid Maximum size: 1.1 cm; Other 2 dimensions: 1.1 x 0.6 cm Composition: solid/almost completely solid (2) Echogenicity: isoechoic (1)  Shape: not taller-than-wide (0) Margins: ill-defined (0) Echogenic foci: punctate echogenic foci (3) ACR TI-RADS total points: 6. ACR TI-RADS risk category: TR4 (4-6 points). ACR TI-RADS recommendations: *Given size (>/= 1 - 1.4 cm) and appearance, a follow-up ultrasound in 1 year should be considered based on TI-RADS criteria. _________________________________________________________ Additional subcentimeter hypoechoic solid nodule in the left mid to lower gland noted incidentally. This nodule does not meet criteria to warrant further evaluation. IMPRESSION: 1. Enlarged, heterogeneous and multinodular thyroid gland. The appearance is most consistent with that of a multinodular goiter. 2. A 1.1 cm TI-RADS category 4 nodule in the left mid gland meets criteria to recommend imaging surveillance. Recommend follow-up ultrasound in 1 year. 3. Numerous additional nodules are noted incidentally but do not meet criteria for surveillance or biopsy. The above is in keeping with the ACR TI-RADS recommendations - J Am Coll Radiol 2017;14:587-595. Electronically Signed   By: Jacqulynn Cadet M.D.   On: 05/15/2021 10:22   DG ESOPHAGUS W SINGLE CM (SOL OR THIN BA)  Result Date: 05/20/2021 CLINICAL DATA:  Evaluate for esophageal leak EXAM: ESOPHOGRAM/BARIUM SWALLOW TECHNIQUE: Single contrast examination was performed using water-soluble contrast (Omnipaque 300). FLUOROSCOPY TIME:  Fluoroscopy Time:  2 minutes 6 seconds Radiation Exposure Index (if provided by the fluoroscopic device): 35.5 mGy Number of Acquired Spot Images: 10 series, 894 images. This includes multiple cine clips. COMPARISON:  Chest radiograph 05/19/2021 FINDINGS: Postsurgical changes of hiatal hernia repair. There is mild esophageal dysmotility with delayed progression of contrast into the stomach. There is no evidence of contrast leak. Contrast empties normally into the duodenum. IMPRESSION: Postsurgical changes of hiatal hernia repair. No evidence of  contrast leak. Electronically Signed   By: Maurine Simmering M.D.   On: 05/20/2021 13:11    Discharge Instructions     Discharge patient   Complete by: As directed    If no problem eating dysphagia diet at lunch   Discharge disposition: 01-Home or Self Care   Discharge patient date: 05/21/2021       Discharge Medications: Allergies as of 05/21/2021   No Known Allergies      Medication List     STOP taking these medications    sucralfate 1 GM/10ML suspension Commonly known as: Carafate   tamsulosin 0.4 MG Caps capsule Commonly known as: FLOMAX       TAKE these medications    OLIVE LEAF EXTRACT PO Take 2 capsules by mouth 2 (two) times daily.   OMEGA-3 + VITAMIN D3 PO Take 1 capsule by mouth 2 (two) times daily.   ondansetron 4 MG disintegrating tablet Commonly known as: Zofran ODT Take 1 tablet (4 mg total) by mouth every 8 (eight) hours as needed for nausea or vomiting.   OVER THE COUNTER MEDICATION Take 1 tablet by mouth 2 (two) times daily. beta glucan with coq10 otc supplement   pantoprazole 20 MG tablet Commonly known as: PROTONIX Take 1 tablet (20 mg total) by mouth daily.   PROBIOTIC-PREBIOTIC PO Take 1 capsule by mouth 2 (two) times daily.        Follow Up Appointments:  Follow-up Information     Lajuana Matte, MD Follow up on 05/26/2021.   Specialty: Cardiothoracic  Surgery Why: Follow up appointment is via teletphone. Please do NOT go to the office. Dr. Kipp Brood will call you at [facility] information: [address] [place] [address] [phone]                 Signed: Gaspar Bidding 05/21/2021, 10:46 AM

## 2021-05-19 NOTE — Anesthesia Preprocedure Evaluation (Signed)
Anesthesia Evaluation  Patient identified by MRN, date of birth, ID band Patient awake    Reviewed: Allergy & Precautions, NPO status , Patient's Chart, lab work & pertinent test results  Airway Mallampati: III  TM Distance: >3 FB Neck ROM: Full    Dental no notable dental hx.    Pulmonary neg pulmonary ROS,    Pulmonary exam normal breath sounds clear to auscultation       Cardiovascular negative cardio ROS Normal cardiovascular exam Rhythm:Regular Rate:Normal     Neuro/Psych negative neurological ROS  negative psych ROS   GI/Hepatic Neg liver ROS, GERD  Controlled,  Endo/Other  negative endocrine ROS  Renal/GU negative Renal ROS     Musculoskeletal negative musculoskeletal ROS (+)   Abdominal   Peds  Hematology negative hematology ROS (+)   Anesthesia Other Findings PEH  Reproductive/Obstetrics                             Anesthesia Physical Anesthesia Plan  ASA: 1  Anesthesia Plan: General   Post-op Pain Management:    Induction: Intravenous  PONV Risk Score and Plan: 3 and Ondansetron, Dexamethasone, Propofol infusion and Treatment may vary due to age or medical condition  Airway Management Planned: Oral ETT  Additional Equipment:   Intra-op Plan:   Post-operative Plan: Extubation in OR  Informed Consent: I have reviewed the patients History and Physical, chart, labs and discussed the procedure including the risks, benefits and alternatives for the proposed anesthesia with the patient or authorized representative who has indicated his/her understanding and acceptance.     Dental advisory given  Plan Discussed with: CRNA  Anesthesia Plan Comments: (Clear Sight)        Anesthesia Quick Evaluation

## 2021-05-19 NOTE — Anesthesia Procedure Notes (Signed)
Procedure Name: Intubation Date/Time: 05/19/2021 7:49 AM Performed by: Georgia Duff, CRNA Pre-anesthesia Checklist: Patient identified, Emergency Drugs available, Suction available and Patient being monitored Patient Re-evaluated:Patient Re-evaluated prior to induction Oxygen Delivery Method: Circle System Utilized Preoxygenation: Pre-oxygenation with 100% oxygen Induction Type: IV induction Ventilation: Mask ventilation without difficulty Laryngoscope Size: Miller and 2 Grade View: Grade I Tube type: Oral Tube size: 7.5 mm Number of attempts: 1 Airway Equipment and Method: Stylet and Oral airway Placement Confirmation: ETT inserted through vocal cords under direct vision, positive ETCO2 and breath sounds checked- equal and bilateral Secured at: 21 cm Tube secured with: Tape Dental Injury: Teeth and Oropharynx as per pre-operative assessment

## 2021-05-19 NOTE — Brief Op Note (Signed)
05/19/2021  11:20 AM  PATIENT:  Dorothy Stone  75 y.o. female  PRE-OPERATIVE DIAGNOSIS:  Paraesophageal hernia  POST-OPERATIVE DIAGNOSIS:  1. Paraesophageal hernia 2. Ventral hernia  PROCEDURE:  ESOPHAGOGASTRODUODENOSCOPY (EGD), XI ROBOTIC ASSISTED PARAESOPHAGEAL HERNIA REPAIR WITH FUNDOPLICATION,  LAPAROSCOPIC VENTRAL HERNIA   SURGEON:  Surgeon(s) and Role:    Lightfoot, Lucile Crater, MD - Primary  PHYSICIAN ASSISTANT: Lars Pinks PA-C  ANESTHESIA:   general  EBL:  Per anesthesia record  BLOOD ADMINISTERED:none  DRAINS: none   LOCAL MEDICATIONS USED:  OTHER Exparel  SPECIMEN:  Source of Specimen:  Hernia sac  DISPOSITION OF SPECIMEN:  PATHOLOGY  COUNTS CORRECT:  YES  DICTATION: .Dragon Dictation  PLAN OF CARE: Admit to inpatient   PATIENT DISPOSITION:  PACU - hemodynamically stable.   Delay start of Pharmacological VTE agent (>24hrs) due to surgical blood loss or risk of bleeding: yes

## 2021-05-19 NOTE — Progress Notes (Signed)
Patient received at 06:04 o'clock Tylenol 1000 mg, per MD verbal order, for headache with good effect.

## 2021-05-19 NOTE — Transfer of Care (Signed)
Immediate Anesthesia Transfer of Care Note  Patient: Dorothy Stone  Procedure(s) Performed: ESOPHAGOGASTRODUODENOSCOPY (EGD) XI ROBOTIC ASSISTED PARAESOPHAGEAL HERNIA REPAIR WITH FUNDOPLICATION (Chest) LAPAROSCOPIC VENTRAL HERNIA (Abdomen)  Patient Location: PACU  Anesthesia Type:General  Level of Consciousness: drowsy and patient cooperative  Airway & Oxygen Therapy: Patient Spontanous Breathing and Patient connected to nasal cannula oxygen  Post-op Assessment: Report given to RN and Post -op Vital signs reviewed and stable  Post vital signs: Reviewed and stable  Last Vitals:  Vitals Value Taken Time  BP 104/50 05/19/21 1132  Temp 36.9 C 05/19/21 1132  Pulse 79 05/19/21 1138  Resp 19 05/19/21 1138  SpO2 93 % 05/19/21 1138  Vitals shown include unvalidated device data.  Last Pain:  Vitals:   05/19/21 1132  TempSrc:   PainSc: Asleep         Complications: No notable events documented.

## 2021-05-19 NOTE — Op Note (Signed)
[address]       [place] [address]             [phone]        05/19/2021  Patient:  Dorothy Stone Pre-Op Dx: Paraesophageal hernia Post-op Dx: Paraesophageal hernia   Ventral hernia Procedure: - Esophagoscopy - Robotic assisted laparoscopy - Paraesophageal hernia repair ACell mesh -Toupet fundoplication -Primary repair of ventral hernia  Surgeon and Role:      * Dorothy Stone, Dorothy Crater, MD - Primary    *Dorothy Stone- assisting at the bedside  Anesthesia  general EBL: 10 ml Blood Administration: None Specimen: Hernia sac, ventral hernia sac   Counts: correct   Indications: 75 year old female with paraesophageal hernia.  She has been admitted recently to the emergency department with chest pain.  I think this is all been due to her paraesophageal hernia.  I personally reviewed her cross-sectional imaging a good portion of her stomach is folded in left chest.  I am concerned that she may be having intermittent incarcerations and possible mild torsion of her stomach with meals.  Given that this has been occurring for several years I do not think that she is at risk of volvulus however I have recommended that we proceed with an EGD and robotic assisted paraesophageal hernia repair with fundoplication as soon as possible.   Findings: Patulous esophagus.  2 x 2 centimeter ventral hernia in the left lower quadrant.  Large paraesophageal hernia with stomach and omentum.  4 sutures posteriorly and 2 sutures anteriorly required to close the defect  Operative Technique: After the risks, benefits and alternatives were thoroughly discussed, the patient was brought to the operative theatre.  Anesthesia was induced, and the esophagoscope was passed through the oropharynx down to the stomach.  The scope was retroflexed and the hiatal hernia was clearly evident.  The scope was pulled back and the mucosal surface of the esophagus was visualized.    The esophagus was  patulous..  The scope was then parked at 25 cm from the incisors.  The patient was then prepped and draped in normal sterile fashion.  An appropriate surgical pause was performed, and pre-operative antibiotics were dosed accordingly.  We began with a 1 cm incision 15 cm caudad from the xiphoid and slightly lateral to the umbilicus.  Using an Optiview we entered the peritoneal space.  The abdomen was then insufflated with CO2.  3 other robotic ports were placed to triangulate the hiatus.  Another 12 mm port was placed in place at the level of the umbilicus laterally for an assistant port and another 5 mm trocar was placed in the right lower quadrant for liver retractor.  The patient was then placed in steep reverse Trendelenburg and the liver was elevated to expose the esophageal hiatus.  And then the robot was docked.  We began by dividing the gastrohepatic ligament to expose the right diaphragmatic crus and then dissected the hernia sac in a clockwise fashion to mobilize there the stomach and esophagus.  We then divided the short gastrics and moved towards the right crus and completed our dissection along the esophageal hiatus.  A Penrose drain was then used to encircle the the esophagus and we continued our dissection up into the mediastinum.  Once we had achieved 3 to 4 cm of intra-abdominal esophagus we then proceeded to reapproximate the crura with 0 Ethibond sutures in an interrupted fashion.  ACell mesh pledgets were used to reapproximate the  crura.  The U-shaped patch was then pexied to the crural repair.  The gastroscope was passed down through the lower esophageal sphincter into the stomach and would act as our bougie during this repair. Next the stomach was passed posterior to the esophagus and toupet fundoplication was performed.  An air leak test was performed using the gastroscope.  No leak was evident.  The liver retractor was removed and all ports were removed under direct visualization.  The  skin and soft tissue were closed with absorbable suture    The patient tolerated the procedure without any immediate complications, and was transferred to the PACU in stable condition.  Dorothy Stone Dorothy Stone

## 2021-05-19 NOTE — Hospital Course (Addendum)
HPI: Dorothy Stone 75 y.o. female presents to discuss surgical management of the paraesophageal hernia.  The patient states that over the last several months she has had worsening right costal and rib pain every time that she eats.  This mostly occurs if she has too much to eat this has been decreasing the size of her meals which has been effective.  She originally presented to the emergency department was noted to have a kidney stone but also found to have this paraesophageal hernia.  When she thinks back there is been several years of her having this pain which is associated with large meals.  She also has a remote history of reflux and has at one point been on regular Tums medication with symptomatic relief.  She has not been on any antireflux medication in several years.  She has lost about 11 pounds over the last 3 months.  She also has a dry cough regularly. She has been admitted recently to the emergency department with chest pain.  IDr.Lightfoot thinks it has all been due to her paraesophageal hernia.  He described the need for robotic assisted para esophageal hernia repair. Potential risks, benefits, and complications of the surgery were discussed with the patient and she agreed to proceed with surgery.  Hospital Course: Patient underwent EGD, Xi robotic assisted repair of para esophageal hernia (with mesh) and fundoplication as well as laparoscopic repair of ventral hernia. Patient was extubated and transported from the OR to PACU in stable condition.

## 2021-05-19 NOTE — Discharge Instructions (Addendum)
May drink clear liquids

## 2021-05-19 NOTE — Interval H&P Note (Signed)
History and Physical Interval Note:  05/19/2021 7:26 AM  Dorothy Stone  has presented today for surgery, with the diagnosis of PEH.  The various methods of treatment have been discussed with the patient and family. After consideration of risks, benefits and other options for treatment, the patient has consented to  Procedure(s): ESOPHAGOGASTRODUODENOSCOPY (EGD) (N/A) XI ROBOTIC ASSISTED PARAESOPHAGEAL HERNIA REPAIR WITH FUNDOPLICATION (N/A) as a surgical intervention.  The patient's history has been reviewed, patient examined, no change in status, stable for surgery.  I have reviewed the patient's chart and labs.  Questions were answered to the patient's satisfaction.     Anton Cheramie [name]

## 2021-05-20 ENCOUNTER — Observation Stay (HOSPITAL_COMMUNITY): Payer: Medicare Other

## 2021-05-20 ENCOUNTER — Encounter (HOSPITAL_COMMUNITY): Payer: Self-pay | Admitting: Thoracic Surgery (Cardiothoracic Vascular Surgery)

## 2021-05-20 DIAGNOSIS — K439 Ventral hernia without obstruction or gangrene: Secondary | ICD-10-CM | POA: Diagnosis not present

## 2021-05-20 DIAGNOSIS — Z87442 Personal history of urinary calculi: Secondary | ICD-10-CM | POA: Diagnosis not present

## 2021-05-20 DIAGNOSIS — Z9889 Other specified postprocedural states: Secondary | ICD-10-CM | POA: Diagnosis not present

## 2021-05-20 DIAGNOSIS — K449 Diaphragmatic hernia without obstruction or gangrene: Secondary | ICD-10-CM | POA: Diagnosis not present

## 2021-05-20 DIAGNOSIS — Z79899 Other long term (current) drug therapy: Secondary | ICD-10-CM | POA: Diagnosis not present

## 2021-05-20 DIAGNOSIS — Z85828 Personal history of other malignant neoplasm of skin: Secondary | ICD-10-CM | POA: Diagnosis not present

## 2021-05-20 LAB — BASIC METABOLIC PANEL
Anion gap: 8 (ref 5–15)
BUN: 12 mg/dL (ref 8–23)
CO2: 24 mmol/L (ref 22–32)
Calcium: 8.6 mg/dL — ABNORMAL LOW (ref 8.9–10.3)
Chloride: 105 mmol/L (ref 98–111)
Creatinine, Ser: 0.7 mg/dL (ref 0.44–1.00)
GFR, Estimated: >60 mL/min (ref >60)
Glucose, Bld: 98 mg/dL (ref 70–99)
Potassium: 4.2 mmol/L (ref 3.5–5.1)
Sodium: 137 mmol/L (ref 135–145)

## 2021-05-20 LAB — CBC
HCT: 35.7 % — ABNORMAL LOW (ref 36.0–46.0)
Hemoglobin: 11.8 g/dL — ABNORMAL LOW (ref 12.0–15.0)
MCH: 30.3 pg (ref 26.0–34.0)
MCHC: 33.1 g/dL (ref 30.0–36.0)
MCV: 91.5 fL (ref 80.0–100.0)
Platelets: 176 10*3/uL (ref 150–400)
RBC: 3.9 MIL/uL (ref 3.87–5.11)
RDW: 12.4 % (ref 11.5–15.5)
WBC: 8.8 10*3/uL (ref 4.0–10.5)
nRBC: 0 % (ref 0.0–0.2)

## 2021-05-20 MED ORDER — KETOROLAC TROMETHAMINE 30 MG/ML IJ SOLN
15.0000 mg | Freq: Four times a day (QID) | INTRAMUSCULAR | Status: AC
  Administered 2021-05-20 – 2021-05-21 (×3): 15 mg via INTRAVENOUS
  Filled 2021-05-20 (×3): qty 1

## 2021-05-20 MED ORDER — DIATRIZOATE MEGLUMINE & SODIUM 66-10 % PO SOLN
ORAL | Status: AC
  Filled 2021-05-20: qty 240

## 2021-05-20 NOTE — Care Management Obs Status (Signed)
[place] NOTIFICATION   Patient Details  Name: Dorothy Stone MRN: [id number] Date of Birth: Feb 28, 1946   Medicare Observation Status Notification Given:  Yes    Carles Collet, RN 05/20/2021, 9:03 AM

## 2021-05-20 NOTE — Care Management CC44 (Signed)
Condition Code 44 Documentation Completed  Patient Details  Name: Dorothy Stone MRN: [id number] Date of Birth: 07/07/1946   Condition Code 44 given:  Yes Patient signature on Condition Code 44 notice:  Yes Documentation of 2 MD's agreement:  Yes Code 44 added to claim:  Yes    Carles Collet, RN 05/20/2021, 9:03 AM

## 2021-05-20 NOTE — Anesthesia Postprocedure Evaluation (Signed)
Anesthesia Post Note  Patient: Dorothy Stone  Procedure(s) Performed: ESOPHAGOGASTRODUODENOSCOPY (EGD) XI ROBOTIC ASSISTED PARAESOPHAGEAL HERNIA REPAIR WITH FUNDOPLICATION (Chest) LAPAROSCOPIC VENTRAL HERNIA (Abdomen)     Patient location during evaluation: PACU Anesthesia Type: General Level of consciousness: awake Pain management: pain level controlled Vital Signs Assessment: post-procedure vital signs reviewed and stable Respiratory status: spontaneous breathing, nonlabored ventilation, respiratory function stable and patient connected to nasal cannula oxygen Cardiovascular status: blood pressure returned to baseline and stable Postop Assessment: no apparent nausea or vomiting Anesthetic complications: no   No notable events documented.  Last Vitals:  Vitals:   05/19/21 2354 05/20/21 0344  BP: 121/62 (!) 115/50  Pulse: 82 79  Resp: 20 15  Temp: 36.7 C 36.9 C  SpO2: 96% 96%    Last Pain:  Vitals:   05/20/21 0400  TempSrc:   PainSc: Asleep                 [name] P Amyjo Mizrachi

## 2021-05-20 NOTE — Progress Notes (Addendum)
[address]       [place] [address]             [phone]      1 Day Post-Op Procedure(s) (LRB): ESOPHAGOGASTRODUODENOSCOPY (EGD) (N/A) XI ROBOTIC ASSISTED PARAESOPHAGEAL HERNIA REPAIR WITH FUNDOPLICATION (N/A) LAPAROSCOPIC VENTRAL HERNIA (N/A) Subjective: Feels pretty well, just back from esophagram  Objective: Vital signs in last 24 hours: Temp:  [97.7 F (36.5 C)-98.5 F (36.9 C)] 98.4 F (36.9 C) (11/12 0716) Pulse Rate:  [69-87] 84 (11/12 0716) Cardiac Rhythm: Normal sinus rhythm;Heart block (11/12 0700) Resp:  [13-21] 20 (11/12 0716) BP: (94-123)/(50-62) 123/62 (11/12 0716) SpO2:  [93 %-96 %] 96 % (11/12 0716)  Hemodynamic parameters for last 24 hours:    Intake/Output from previous day: 11/11 0701 - 11/12 0700 In: 2000 [I.V.:1800; IV Piggyback:200] Out: 577 [Urine:576; Stool:1] Intake/Output this shift: No intake/output data recorded.  General appearance: alert, cooperative, and no distress Heart: irregularly irregular rhythm Lungs: clear to auscultation bilaterally Abdomen: benign Extremities: no edema Wound: minor erethema- may be suture or dermabond rxn, no drainage  Lab Results: Recent Labs    05/20/21 0050  WBC 8.8  HGB 11.8*  HCT 35.7*  PLT 176   BMET:  Recent Labs    05/20/21 0050  NA 137  K 4.2  CL 105  CO2 24  GLUCOSE 98  BUN 12  CREATININE 0.70  CALCIUM 8.6*    PT/INR: No results for input(s): LABPROT, INR in the last 72 hours. ABG    Component Value Date/Time   PHART 7.407 05/17/2021 0958   HCO3 23.6 05/17/2021 0958   ACIDBASEDEF 0.4 05/17/2021 0958   O2SAT 96.9 05/17/2021 0958   CBG (last 3)  No results for input(s): GLUCAP in the last 72 hours.  Meds Scheduled Meds:  acetaminophen  1,000 mg Oral Q6H   Or   acetaminophen (TYLENOL) oral liquid 160 mg/5 mL  1,000 mg Oral Q6H   bisacodyl  10 mg Oral Daily   diatrizoate meglumine-sodium       enoxaparin (LOVENOX) injection  40 mg Subcutaneous  Daily   ketorolac  30 mg Intravenous Q6H   ondansetron (ZOFRAN) IV  4 mg Intravenous Q6H   pantoprazole (PROTONIX) IV  40 mg Intravenous Q24H   Continuous Infusions: PRN Meds:.morphine injection  Xrays DG Chest Port 1 View  Result Date: 05/19/2021 CLINICAL DATA:  Paraesophageal hernia repair. EXAM: PORTABLE CHEST 1 VIEW COMPARISON:  Chest x-ray dated May 17, 2021. FINDINGS: Normal heart size. Persistent extrapulmonary air at the left lung base behind the heart may reflect postsurgical pneumomediastinum. New trace right apical pneumothorax, estimated at 5%. No left pneumothorax. Mild left basilar atelectasis. No consolidation or large pleural effusion. Subcutaneous emphysema in the left chest wall and lower neck. No acute osseous abnormality. IMPRESSION: 1. New trace right apical pneumothorax, estimated at 5%. 2. Persistent extrapulmonary air at the left lung base behind the heart may reflect postsurgical pneumomediastinum. Electronically Signed   By: Titus Dubin M.D.   On: 05/19/2021 14:04    Assessment/Plan: S/P Procedure(s) (LRB): ESOPHAGOGASTRODUODENOSCOPY (EGD) (N/A) XI ROBOTIC ASSISTED PARAESOPHAGEAL HERNIA REPAIR WITH FUNDOPLICATION (N/A) LAPAROSCOPIC VENTRAL HERNIA (N/A)  1 afeb, VSS s BP 90's-120's 2 sats good on 2 liters, wean as able 3 adeq UOP- normal renal fxn 4 minor expected ABLA 5 looks like no leak, somewhat tight prob from edema, will see how she does with clears and advance to soft- if no issues prob d/c in am  LOS: 1 day    John Giovanni PA-C Pager [phone] 05/20/2021    Chart reviewed, patient examined, agree with above. Esophagram looks ok. Plan home tomorrow if she tolerates a diet today.

## 2021-05-21 DIAGNOSIS — Z85828 Personal history of other malignant neoplasm of skin: Secondary | ICD-10-CM | POA: Diagnosis not present

## 2021-05-21 DIAGNOSIS — Z79899 Other long term (current) drug therapy: Secondary | ICD-10-CM | POA: Diagnosis not present

## 2021-05-21 DIAGNOSIS — K439 Ventral hernia without obstruction or gangrene: Secondary | ICD-10-CM | POA: Diagnosis not present

## 2021-05-21 DIAGNOSIS — Z87442 Personal history of urinary calculi: Secondary | ICD-10-CM | POA: Diagnosis not present

## 2021-05-21 DIAGNOSIS — K449 Diaphragmatic hernia without obstruction or gangrene: Secondary | ICD-10-CM | POA: Diagnosis not present

## 2021-05-21 MED ORDER — ONDANSETRON 4 MG PO TBDP: 4.0000 mg | ORAL_TABLET | Freq: Three times a day (TID) | ORAL | 0 refills | Status: AC | PRN

## 2021-05-21 MED ORDER — PANTOPRAZOLE SODIUM 20 MG PO TBEC: 20.0000 mg | DELAYED_RELEASE_TABLET | Freq: Every day | ORAL | 1 refills | Status: AC

## 2021-05-21 NOTE — Progress Notes (Signed)
Feels better after taking a nap.claimed that feeling weak is gone.

## 2021-05-21 NOTE — Progress Notes (Signed)
Discharged home accompanied by son  and  husband. Belongings taken home.

## 2021-05-21 NOTE — Progress Notes (Addendum)
[address]       [place] [address]             [phone]      2 Days Post-Op Procedure(s) (LRB): ESOPHAGOGASTRODUODENOSCOPY (EGD) (N/A) XI ROBOTIC ASSISTED PARAESOPHAGEAL HERNIA REPAIR WITH FUNDOPLICATION (N/A) LAPAROSCOPIC VENTRAL HERNIA (N/A) Subjective: No problem with clears  Objective: Vital signs in last 24 hours: Temp:  [98 F (36.7 C)-98.9 F (37.2 C)] 98.7 F (37.1 C) (11/13 0733) Pulse Rate:  [67-77] 71 (11/13 0733) Cardiac Rhythm: Normal sinus rhythm (11/13 0733) Resp:  [13-19] 16 (11/13 0733) BP: (92-128)/(54-65) 114/59 (11/13 0733) SpO2:  [92 %-97 %] 97 % (11/13 0733)  Hemodynamic parameters for last 24 hours:    Intake/Output from previous day: 11/12 0701 - 11/13 0700 In: 210 [P.O.:210] Out: -  Intake/Output this shift: Total I/O In: 360 [P.O.:360] Out: -   General appearance: alert, cooperative, and no distress Heart: regular rate and rhythm Lungs: clear to auscultation bilaterally Abdomen: benign Extremities: no edema or calf tenderness Wound: incis healing well  Lab Results: Recent Labs    05/20/21 0050  WBC 8.8  HGB 11.8*  HCT 35.7*  PLT 176   BMET:  Recent Labs    05/20/21 0050  NA 137  K 4.2  CL 105  CO2 24  GLUCOSE 98  BUN 12  CREATININE 0.70  CALCIUM 8.6*    PT/INR: No results for input(s): LABPROT, INR in the last 72 hours. ABG    Component Value Date/Time   PHART 7.407 05/17/2021 0958   HCO3 23.6 05/17/2021 0958   ACIDBASEDEF 0.4 05/17/2021 0958   O2SAT 96.9 05/17/2021 0958   CBG (last 3)  No results for input(s): GLUCAP in the last 72 hours.  Meds Scheduled Meds:  acetaminophen  1,000 mg Oral Q6H   Or   acetaminophen (TYLENOL) oral liquid 160 mg/5 mL  1,000 mg Oral Q6H   bisacodyl  10 mg Oral Daily   enoxaparin (LOVENOX) injection  40 mg Subcutaneous Daily   ondansetron (ZOFRAN) IV  4 mg Intravenous Q6H   pantoprazole (PROTONIX) IV  40 mg Intravenous Q24H   Continuous  Infusions: PRN Meds:.morphine injection  Xrays DG Chest Port 1 View  Result Date: 05/19/2021 CLINICAL DATA:  Paraesophageal hernia repair. EXAM: PORTABLE CHEST 1 VIEW COMPARISON:  Chest x-ray dated May 17, 2021. FINDINGS: Normal heart size. Persistent extrapulmonary air at the left lung base behind the heart may reflect postsurgical pneumomediastinum. New trace right apical pneumothorax, estimated at 5%. No left pneumothorax. Mild left basilar atelectasis. No consolidation or large pleural effusion. Subcutaneous emphysema in the left chest wall and lower neck. No acute osseous abnormality. IMPRESSION: 1. New trace right apical pneumothorax, estimated at 5%. 2. Persistent extrapulmonary air at the left lung base behind the heart may reflect postsurgical pneumomediastinum. Electronically Signed   By: Titus Dubin M.D.   On: 05/19/2021 14:04   DG ESOPHAGUS W SINGLE CM (SOL OR THIN BA)  Result Date: 05/20/2021 CLINICAL DATA:  Evaluate for esophageal leak EXAM: ESOPHOGRAM/BARIUM SWALLOW TECHNIQUE: Single contrast examination was performed using water-soluble contrast (Omnipaque 300). FLUOROSCOPY TIME:  Fluoroscopy Time:  2 minutes 6 seconds Radiation Exposure Index (if provided by the fluoroscopic device): 35.5 mGy Number of Acquired Spot Images: 10 series, 894 images. This includes multiple cine clips. COMPARISON:  Chest radiograph 05/19/2021 FINDINGS: Postsurgical changes of hiatal hernia repair. There is mild esophageal dysmotility with delayed progression of contrast into the stomach. There is no  evidence of contrast leak. Contrast empties normally into the duodenum. IMPRESSION: Postsurgical changes of hiatal hernia repair. No evidence of contrast leak. Electronically Signed   By: Maurine Simmering M.D.   On: 05/20/2021 13:11    Assessment/Plan: S/P Procedure(s) (LRB): ESOPHAGOGASTRODUODENOSCOPY (EGD) (N/A) XI ROBOTIC ASSISTED PARAESOPHAGEAL HERNIA REPAIR WITH FUNDOPLICATION (N/A) LAPAROSCOPIC  VENTRAL HERNIA (N/A)  1 afeb, VSS sinus rhythm 2 sats good on RA 3 voiding 4 no new labs or CXR's 5 if tolerates D1 diet and ambulates without problem will d/c home today  LOS: 1 day    Dorothy Stone 05/21/2021    Chart reviewed, patient examined, agree with above. Esophagram showed no leak and some delayed esophageal emptying into stomach. She tolerated clear liquids well. Possibly home if she tolerates soft diet.

## 2021-05-21 NOTE — Progress Notes (Signed)
Assisted to the bathroom and moved her bowel. Complained  of feeling weak when started ambulating to the hallway. Assisted back to bed. Bp 140.80. hr- 80 SR. Bedrest for now. Continue to monitor.

## 2021-05-21 NOTE — Progress Notes (Signed)
Tolerated 100 % of the food during lunch. Denied any  pain nor feeling nauseous. After 2 hours post lunch. Ambulated along the hallway  wit 1 assist, felt a little bit wobbly claming she feels sore on the incision site. Assisted back to bed.

## 2021-05-22 LAB — SURGICAL PATHOLOGY

## 2021-05-26 ENCOUNTER — Ambulatory Visit (INDEPENDENT_AMBULATORY_CARE_PROVIDER_SITE_OTHER): Payer: Self-pay | Admitting: Thoracic Surgery (Cardiothoracic Vascular Surgery)

## 2021-05-26 ENCOUNTER — Other Ambulatory Visit: Payer: Self-pay

## 2021-05-26 DIAGNOSIS — Z8719 Personal history of other diseases of the digestive system: Secondary | ICD-10-CM

## 2021-05-26 DIAGNOSIS — Z9889 Other specified postprocedural states: Secondary | ICD-10-CM

## 2021-05-26 NOTE — Progress Notes (Signed)
     [address]       [place] [address]             [phone]       Patient: Home Provider: Office Consent for Telemedicine visit obtained.  Today's visit was completed via a real-time telehealth (see specific modality noted below). The patient/authorized person provided oral consent at the time of the visit to engage in a telemedicine encounter with the present provider at [facility]. The patient/authorized person was informed of the potential benefits, limitations, and risks of telemedicine. The patient/authorized person expressed understanding that the laws that protect confidentiality also apply to telemedicine. The patient/authorized person acknowledged understanding that telemedicine does not provide emergency services and that he or she would need to call 911 or proceed to the nearest hospital for help if such a need arose.   Total time spent in the clinical discussion 10 minutes.  Telehealth Modality: Phone visit (audio only)  I had a telephone visit with Dorothy Stone.  Overall she is doing well.  Swallowing is gotten a lot better.  She is able to tolerate difficulty.  She does complain of pain at the ventral repair site.  Of instructed her that she no longer needs to crush her medications, and she can advance her diet.  I will see her back in 1 month with a chest x-ray.

## 2021-05-29 ENCOUNTER — Telehealth: Payer: Self-pay

## 2021-05-29 NOTE — Telephone Encounter (Signed)
Patient contacted the office stating that she was having some constipation issues. She is s/p RATs/ Paraesophageal Hernia Repair with Dr. Kipp Brood 05/19/21. Advised that she can take over-the-counter Colace as directed on the bottle and if that does not work she can also start Miralax to help with bowel movements. She acknowledged receipt.

## 2021-06-07 ENCOUNTER — Telehealth: Payer: Self-pay | Admitting: *Deleted

## 2021-06-07 NOTE — Telephone Encounter (Signed)
Dorothy Stone contacted the office asking if she may begin eating fresh fruits and vegetable. Patient is s/p robotic PEH repair 11/11 by Dr. Kipp Brood. She has advanced her diet without any difficulty. Per Dr. Kipp Brood, she may begin to eat raw fresh fruits and veggies. Patient verbalizes understanding.

## 2021-06-21 DIAGNOSIS — H353111 Nonexudative age-related macular degeneration, right eye, early dry stage: Secondary | ICD-10-CM | POA: Diagnosis not present

## 2021-06-21 DIAGNOSIS — H353122 Nonexudative age-related macular degeneration, left eye, intermediate dry stage: Secondary | ICD-10-CM | POA: Diagnosis not present

## 2021-06-21 DIAGNOSIS — H04123 Dry eye syndrome of bilateral lacrimal glands: Secondary | ICD-10-CM | POA: Diagnosis not present

## 2021-06-21 DIAGNOSIS — H26492 Other secondary cataract, left eye: Secondary | ICD-10-CM | POA: Diagnosis not present

## 2021-06-26 ENCOUNTER — Other Ambulatory Visit: Payer: Self-pay | Admitting: Thoracic Surgery (Cardiothoracic Vascular Surgery)

## 2021-06-26 DIAGNOSIS — K449 Diaphragmatic hernia without obstruction or gangrene: Secondary | ICD-10-CM

## 2021-07-07 ENCOUNTER — Ambulatory Visit (INDEPENDENT_AMBULATORY_CARE_PROVIDER_SITE_OTHER): Payer: Self-pay | Admitting: Thoracic Surgery (Cardiothoracic Vascular Surgery)

## 2021-07-07 ENCOUNTER — Ambulatory Visit
Admission: RE | Admit: 2021-07-07 | Discharge: 2021-07-07 | Disposition: A | Discharge status: Home / Self Care | Payer: Medicare Other | Source: Ambulatory Visit | Attending: Thoracic Surgery (Cardiothoracic Vascular Surgery) | Admitting: Thoracic Surgery (Cardiothoracic Vascular Surgery)

## 2021-07-07 ENCOUNTER — Other Ambulatory Visit: Payer: Self-pay

## 2021-07-07 VITALS — BP 147/74 | HR 87 | Resp 20 | Ht 62.0 in | Wt 150.0 lb

## 2021-07-07 DIAGNOSIS — K449 Diaphragmatic hernia without obstruction or gangrene: Secondary | ICD-10-CM

## 2021-07-07 DIAGNOSIS — E669 Obesity, unspecified: Secondary | ICD-10-CM | POA: Insufficient documentation

## 2021-07-07 DIAGNOSIS — E785 Hyperlipidemia, unspecified: Secondary | ICD-10-CM | POA: Insufficient documentation

## 2021-07-07 DIAGNOSIS — M810 Age-related osteoporosis without current pathological fracture: Secondary | ICD-10-CM | POA: Insufficient documentation

## 2021-07-07 DIAGNOSIS — M47814 Spondylosis without myelopathy or radiculopathy, thoracic region: Secondary | ICD-10-CM | POA: Diagnosis not present

## 2021-07-07 DIAGNOSIS — Z9889 Other specified postprocedural states: Secondary | ICD-10-CM

## 2021-07-07 DIAGNOSIS — Z8719 Personal history of other diseases of the digestive system: Secondary | ICD-10-CM

## 2021-07-07 NOTE — Progress Notes (Signed)
° °   °  [address]       [place] [address]             [phone]        Dorothy Stone  Medical Record #[id number] Date of Birth: Aug 18, 1945  Referring: Dorothy Pew, MD Primary Care: Dorothy Smoker, MD Primary Cardiologist:None  Reason for visit:   follow-up  History of Present Illness:     [name] presents for a 1 month follow-up appointment.  Overall she is doing quite well.  She only complains of some pain at her abdominal wall hernia site.  She denies any reflux or dysphagia.  She is tolerating a regular diet without any difficulties.  Physical Exam: BP (!) 147/74    Pulse 87    Resp 20    Ht 5\' 2"  (1.575 m)    Wt 150 lb (68 kg)    SpO2 95% Comment: RA   BMI 27.44 kg/m   Alert NAD Incisions clean.  Abdomen soft, ND No peripheral edema   Diagnostic Studies & Laboratory data: CXR: Clear     Assessment / Plan:   75 year old female status post paraesophageal hernia repair with toupee fundoplication.  She also had a ventral hernia that was located close to the assistant port.  Overall she is doing well.  I told her that she no longer needs to take her Protonix.  She also has a thyroid goiter that was noted on ultrasound.  I have made a referral to [facility] for further evaluation.  She will follow-up with me in 6 months virtually for a symptom check.  Dorothy Stone 07/07/2021 11:53 AM

## 2021-08-16 DIAGNOSIS — N200 Calculus of kidney: Secondary | ICD-10-CM | POA: Diagnosis not present

## 2021-08-16 DIAGNOSIS — N281 Cyst of kidney, acquired: Secondary | ICD-10-CM | POA: Diagnosis not present

## 2021-10-06 DIAGNOSIS — D440 Neoplasm of uncertain behavior of thyroid gland: Secondary | ICD-10-CM | POA: Diagnosis not present

## 2021-10-17 DIAGNOSIS — Z0000 Encounter for general adult medical examination without abnormal findings: Secondary | ICD-10-CM | POA: Diagnosis not present

## 2021-10-17 DIAGNOSIS — E785 Hyperlipidemia, unspecified: Secondary | ICD-10-CM | POA: Diagnosis not present

## 2021-10-17 DIAGNOSIS — M810 Age-related osteoporosis without current pathological fracture: Secondary | ICD-10-CM | POA: Diagnosis not present

## 2021-10-17 DIAGNOSIS — R030 Elevated blood-pressure reading, without diagnosis of hypertension: Secondary | ICD-10-CM | POA: Diagnosis not present

## 2021-10-17 DIAGNOSIS — Z1211 Encounter for screening for malignant neoplasm of colon: Secondary | ICD-10-CM | POA: Diagnosis not present

## 2021-12-20 DIAGNOSIS — H353111 Nonexudative age-related macular degeneration, right eye, early dry stage: Secondary | ICD-10-CM | POA: Diagnosis not present

## 2021-12-20 DIAGNOSIS — H26492 Other secondary cataract, left eye: Secondary | ICD-10-CM | POA: Diagnosis not present

## 2021-12-20 DIAGNOSIS — H353122 Nonexudative age-related macular degeneration, left eye, intermediate dry stage: Secondary | ICD-10-CM | POA: Diagnosis not present

## 2021-12-20 DIAGNOSIS — H04123 Dry eye syndrome of bilateral lacrimal glands: Secondary | ICD-10-CM | POA: Diagnosis not present

## 2022-01-05 ENCOUNTER — Telehealth: Payer: Medicare Other | Admitting: Thoracic Surgery (Cardiothoracic Vascular Surgery)

## 2022-01-11 ENCOUNTER — Telehealth: Payer: Medicare Other | Admitting: Thoracic Surgery (Cardiothoracic Vascular Surgery)

## 2022-01-12 ENCOUNTER — Telehealth: Payer: Medicare Other | Admitting: Thoracic Surgery (Cardiothoracic Vascular Surgery)

## 2022-03-22 ENCOUNTER — Telehealth: Payer: Self-pay | Admitting: *Deleted

## 2022-03-22 NOTE — Patient Outreach (Signed)
  Care Coordination   Initial Visit Note   03/22/2022 Name: Dorothy Stone MRN: [id number] DOB: 1946-02-09  Dorothy Stone is a 76 y.o. year old female who sees Lindell Noe, Anastasia Pall, MD for primary care. I spoke with  Dorothy Stone by phone today.  What matters to the patients health and wellness today?  No needs   Goals Addressed               This Visit's Progress     COMPLETED: No needs (pt-stated)        Care Coordination Interventions: Advised patient to Contract her provider's office and scheduled her AWV for 2023 Reviewed scheduled/upcoming provider appointments including other appointments not listed in Epic with sufficient transportation resource. Assessed social determinant of health barriers          SDOH assessments and interventions completed:  Yes  SDOH Interventions Today    Flowsheet Row Most Recent Value  SDOH Interventions   Transportation Interventions Intervention Not Indicated        Care Coordination Interventions Activated:  Yes  Care Coordination Interventions:  Yes, provided   Follow up plan: No further intervention required.   Encounter Outcome:  Pt. Visit Completed   Raina Mina, RN Care Management Coordinator [place] Office [phone]

## 2022-03-22 NOTE — Patient Instructions (Signed)
Visit Information  Thank you for taking time to visit with me today. Please don't hesitate to contact me if I can be of assistance to you.   Following are the goals we discussed today:   Goals Addressed               This Visit's Progress     COMPLETED: No needs (pt-stated)        Care Coordination Interventions: Advised patient to Contract her provider's office and scheduled her AWV for 2023 Reviewed scheduled/upcoming provider appointments including other appointments not listed in Epic with sufficient transportation resource. Assessed social determinant of health barriers          Please call the care guide team at [phone] if you need to cancel or reschedule your appointment.   If you are experiencing a Mental Health or [place] or need someone to talk to, please call the Suicide and Crisis Lifeline: 988  The patient verbalized understanding of instructions, educational materials, and care plan provided today and DECLINED offer to receive copy of patient instructions, educational materials, and care plan.   No further follow up required: No needs  Raina Mina, RN Care Management Coordinator [place] Office [phone]

## 2022-07-06 IMAGING — CT CT RENAL STONE PROTOCOL
2 of 4 series · 15 of 46 positions shown, 17 images · non-contrast
Comparison: None.

CLINICAL DATA: Right-sided abdominal pain.  Possible kidney stone.

EXAM:
CT ABDOMEN AND PELVIS WITHOUT CONTRAST
TECHNIQUE: Multidetector CT imaging of the abdomen and pelvis was performed
following the standard protocol without IV contrast.

[Series 2: axial st · axial · 0.81mm/px · z∈[+867,+1247]mm · 12 of 88 slices shown, 14 images]
[im 6/88  soft-tissue]
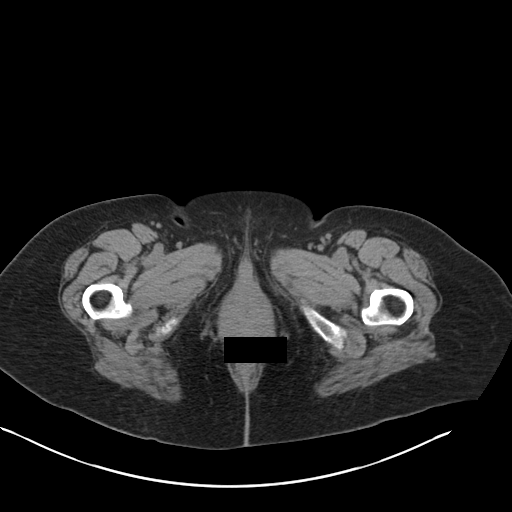
[im 6/88  bone]
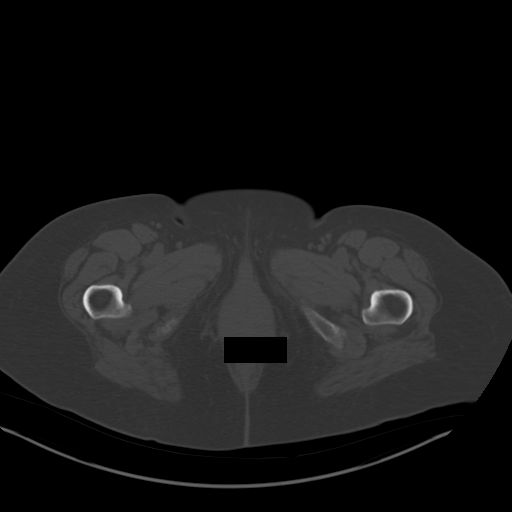
[im 11/88  soft-tissue]
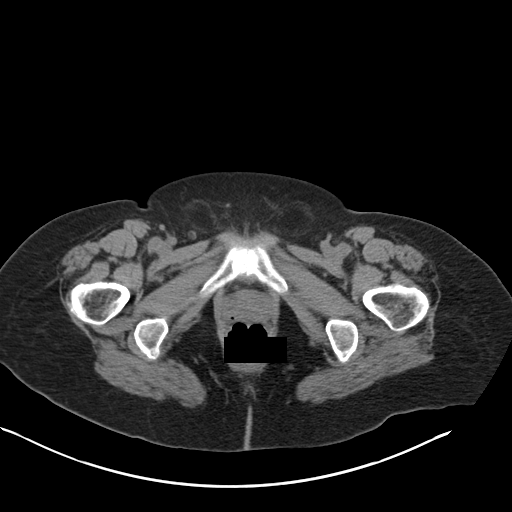
[im 22/88  soft-tissue]
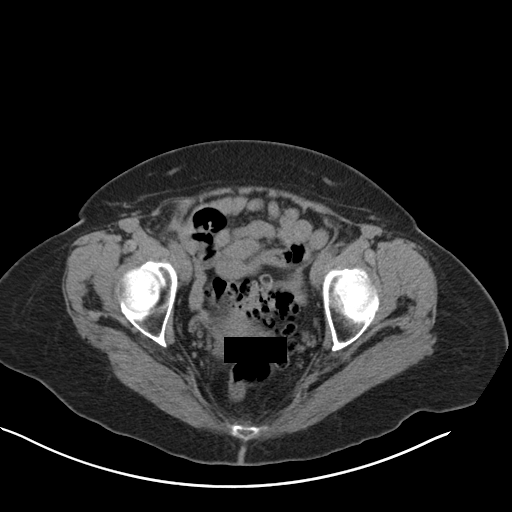
[im 28/88  soft-tissue]
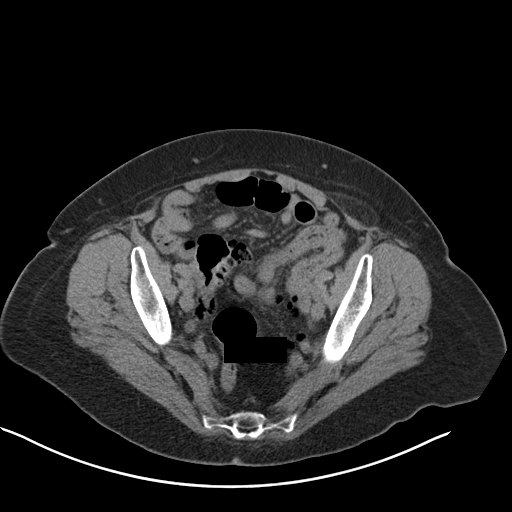
[im 33/88  soft-tissue]
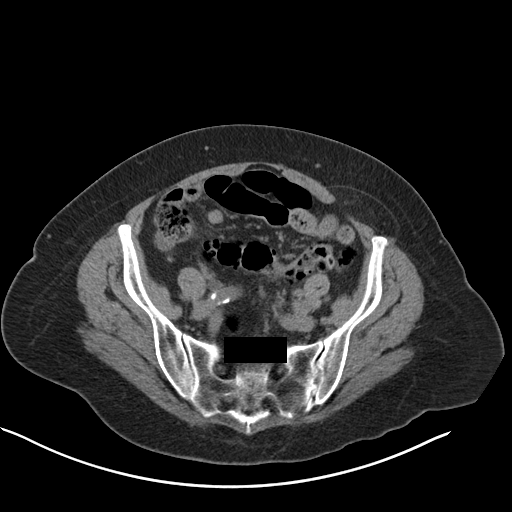
[im 39/88  soft-tissue]
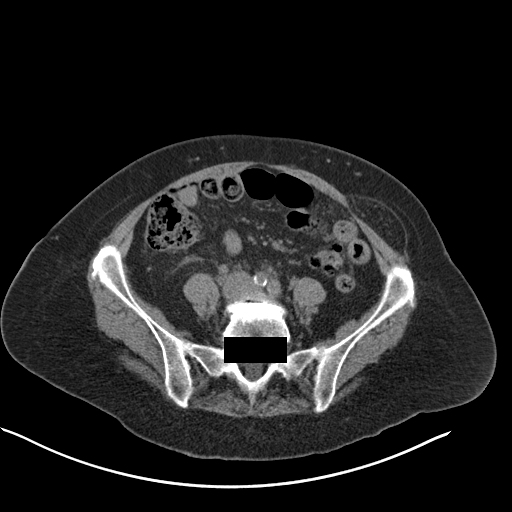
[im 49/88  soft-tissue]
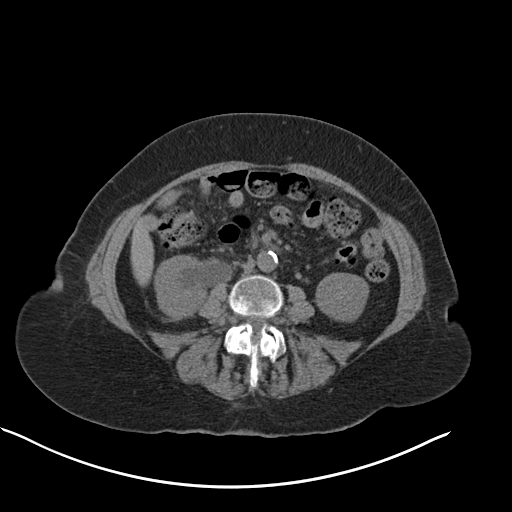
[im 55/88  soft-tissue]
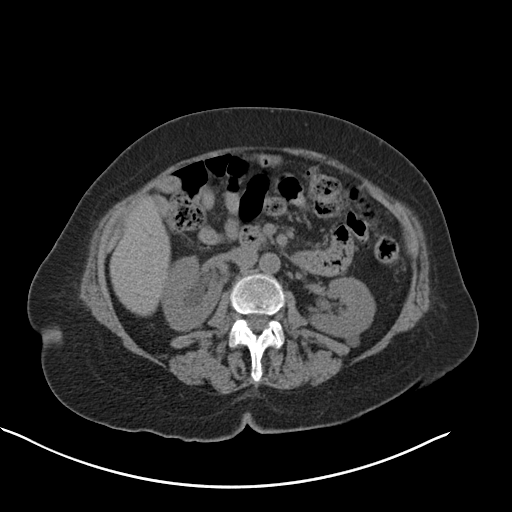
[im 60/88  soft-tissue]
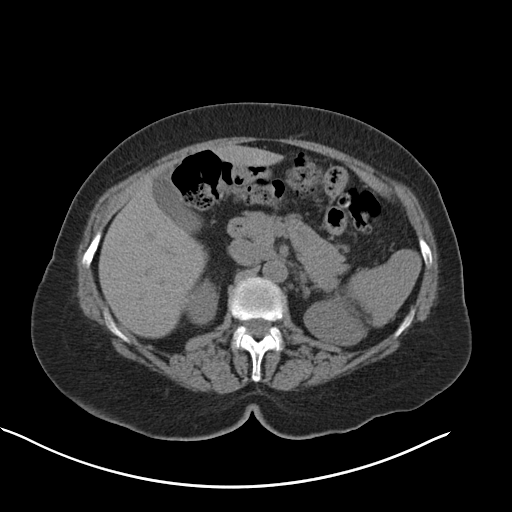
[im 60/88  bone]
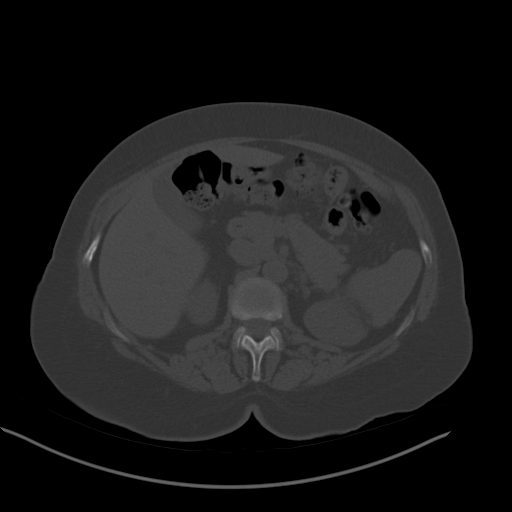
[im 66/88  soft-tissue]
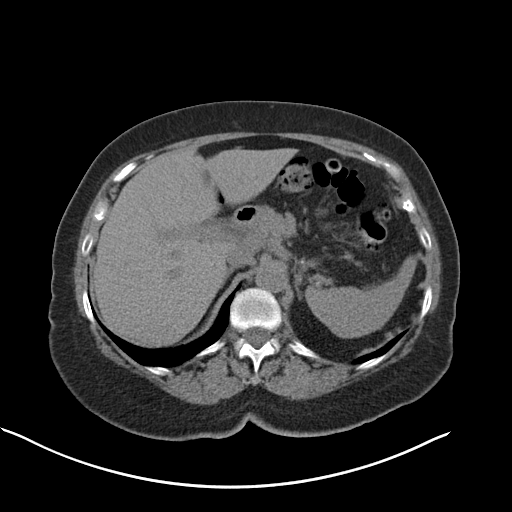
[im 77/88  soft-tissue]
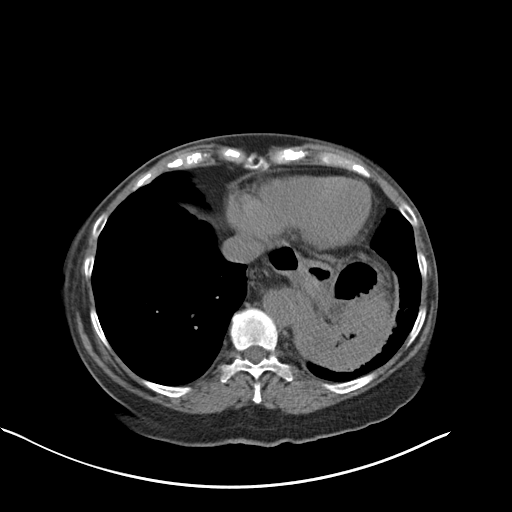
[im 82/88  soft-tissue]
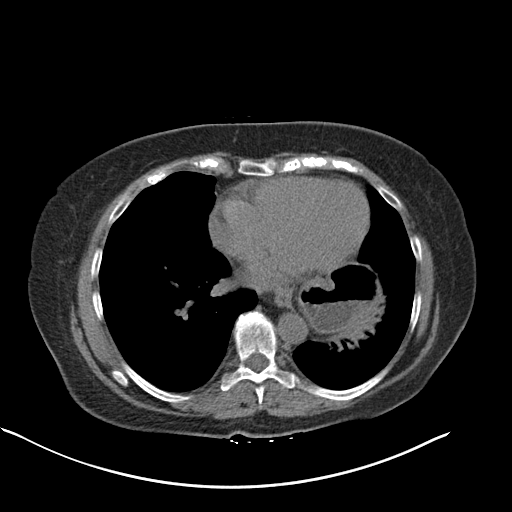

[Series 5: coronal · coronal · 0.85mm/px · 3 of 151 slices shown]
[im 51/151  soft-tissue]
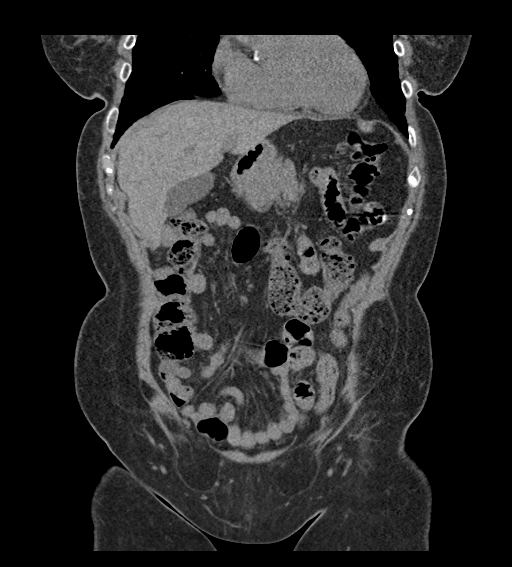
[im 67/151  soft-tissue]
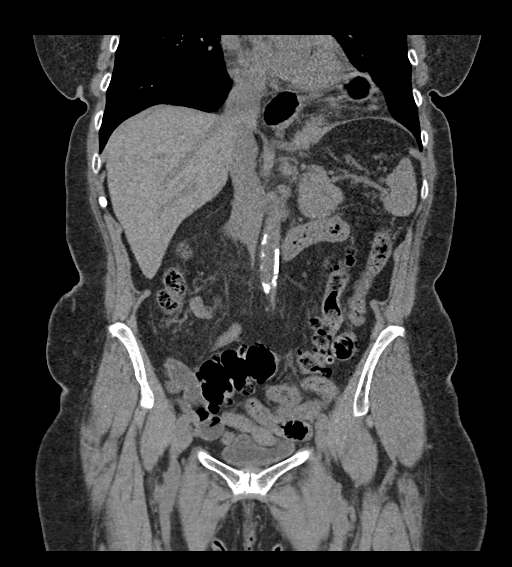
[im 84/151  soft-tissue]
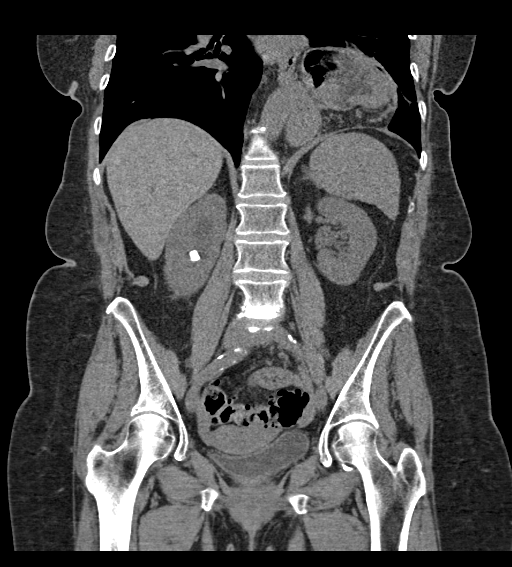

[15 of 46 positions shown; findings below may reference images not displayed]

FINDINGS: Lower chest: Large hiatal hernia with the majority of the stomach
being intrathoracic. No abnormal gastric distension or inflammation.
Adjacent compressive atelectasis in the left lower lobe. No pleural
fluid.

Hepatobiliary: No focal liver abnormality is seen. No gallstones,
gallbladder wall thickening, or biliary dilatation.

Pancreas: No ductal dilatation or inflammation.

Spleen: Normal in size without focal abnormality.

Adrenals/Urinary Tract: Minimal left adrenal thickening. No dominant
adrenal nodule.

Moderate right hydroureteronephrosis. The ureter is dilated to the
distal ureter just proximal to the bladder insertion. No ureteral
calculi or cause for obstruction. There is mild right perinephric
edema. Ureteral thickening and stranding is noted in the proximal
ureter just beyond the ureteropelvic junction. There is a 9 mm
nonobstructing stone in the lower right kidney. Multiple right
retroperitoneal calcifications appear to represent phleboliths. No
left hydronephrosis or stone. Tiny partially exophytic cyst from the
posterior left kidney. Left ureter is decompressed. Urinary bladder
is minimally distended. No bladder stone. No bladder wall
thickening. No urethral stone is seen.

Stomach/Bowel: Large hiatal hernia with the majority of the stomach
being intrathoracic. There is occasional fecalization of small bowel
contents. No obstruction or wall thickening. Normal appendix is
tentatively visualized. Colonic diverticulosis is prominent from the
splenic flexure distally. No evidence of diverticulitis. There is
significant colonic redundancy.

Vascular/Lymphatic: Moderate aorto bi-iliac atherosclerosis. No
aortic aneurysm. Small retroperitoneal lymph nodes are not enlarged
by size criteria. There is no pelvic adenopathy.

Reproductive: Unremarkable uterus.  No adnexal mass.

Other: Fat containing bilateral inguinal hernias. There is a fat
containing left lower quadrant ventral abdominal wall hernia. No
free fluid or ascites. No

Musculoskeletal: Degenerative disc disease at L5-S1 with Modic
endplate changes. Degenerative change of the pubic symphysis. There
is lower lumbar facet hypertrophy. No focal bone lesion or acute
osseous abnormality.
IMPRESSION: 1. Moderate right hydroureteronephrosis. The ureter is dilated to
the distal ureter just proximal to the bladder insertion. No
ureteral calculi or cause for obstruction.
2. There is ureteral thickening and fat stranding in the proximal
right ureter just beyond the ureteropelvic junction. This is
nonspecific, may be related to recently passed stone. Direct
visualization with urography may be helpful to exclude possibility
of urothelial lesion.
3. Nonobstructing 9 mm stone in the lower right kidney.
4. Large hiatal hernia with the majority of the stomach being
intrathoracic.
5. Colonic diverticulosis without diverticulitis.
6. Fat containing bilateral inguinal and left lower quadrant ventral
abdominal wall hernias.

Aortic Atherosclerosis ([id number]).

## 2022-07-11 DIAGNOSIS — H26492 Other secondary cataract, left eye: Secondary | ICD-10-CM | POA: Diagnosis not present

## 2022-07-11 DIAGNOSIS — H353122 Nonexudative age-related macular degeneration, left eye, intermediate dry stage: Secondary | ICD-10-CM | POA: Diagnosis not present

## 2022-07-11 DIAGNOSIS — H353111 Nonexudative age-related macular degeneration, right eye, early dry stage: Secondary | ICD-10-CM | POA: Diagnosis not present

## 2022-07-11 DIAGNOSIS — R7303 Prediabetes: Secondary | ICD-10-CM | POA: Diagnosis not present

## 2022-08-20 ENCOUNTER — Other Ambulatory Visit: Payer: Self-pay | Admitting: Otolaryngology

## 2022-08-20 DIAGNOSIS — E042 Nontoxic multinodular goiter: Secondary | ICD-10-CM

## 2022-09-13 ENCOUNTER — Ambulatory Visit
Admission: RE | Admit: 2022-09-13 | Discharge: 2022-09-13 | Disposition: A | Discharge status: Home / Self Care | Payer: Medicare Other | Source: Ambulatory Visit | Attending: Otolaryngology | Admitting: Otolaryngology

## 2022-09-13 DIAGNOSIS — E042 Nontoxic multinodular goiter: Secondary | ICD-10-CM

## 2022-09-13 DIAGNOSIS — C73 Malignant neoplasm of thyroid gland: Secondary | ICD-10-CM | POA: Diagnosis not present

## 2022-09-13 DIAGNOSIS — E041 Nontoxic single thyroid nodule: Secondary | ICD-10-CM | POA: Diagnosis not present

## 2022-10-03 ENCOUNTER — Other Ambulatory Visit: Payer: Self-pay | Admitting: Family Medicine

## 2022-10-03 DIAGNOSIS — E041 Nontoxic single thyroid nodule: Secondary | ICD-10-CM

## 2022-10-25 DIAGNOSIS — Z79899 Other long term (current) drug therapy: Secondary | ICD-10-CM | POA: Diagnosis not present

## 2022-10-25 DIAGNOSIS — E785 Hyperlipidemia, unspecified: Secondary | ICD-10-CM | POA: Diagnosis not present

## 2022-10-25 DIAGNOSIS — Z2821 Immunization not carried out because of patient refusal: Secondary | ICD-10-CM | POA: Diagnosis not present

## 2022-10-25 DIAGNOSIS — E041 Nontoxic single thyroid nodule: Secondary | ICD-10-CM | POA: Diagnosis not present

## 2022-10-25 DIAGNOSIS — M810 Age-related osteoporosis without current pathological fracture: Secondary | ICD-10-CM | POA: Diagnosis not present

## 2022-10-25 DIAGNOSIS — Z0000 Encounter for general adult medical examination without abnormal findings: Secondary | ICD-10-CM | POA: Diagnosis not present

## 2023-01-23 DIAGNOSIS — H353122 Nonexudative age-related macular degeneration, left eye, intermediate dry stage: Secondary | ICD-10-CM | POA: Diagnosis not present

## 2023-01-23 DIAGNOSIS — H04123 Dry eye syndrome of bilateral lacrimal glands: Secondary | ICD-10-CM | POA: Diagnosis not present

## 2023-01-23 DIAGNOSIS — H353111 Nonexudative age-related macular degeneration, right eye, early dry stage: Secondary | ICD-10-CM | POA: Diagnosis not present

## 2023-01-23 DIAGNOSIS — H26492 Other secondary cataract, left eye: Secondary | ICD-10-CM | POA: Diagnosis not present

## 2023-02-06 DIAGNOSIS — E785 Hyperlipidemia, unspecified: Secondary | ICD-10-CM | POA: Diagnosis not present

## 2023-02-06 DIAGNOSIS — Z5181 Encounter for therapeutic drug level monitoring: Secondary | ICD-10-CM | POA: Diagnosis not present

## 2023-02-11 IMAGING — CT CT CHEST W/O CM
1 series · 15 of 34 positions shown, 19 images · non-contrast
Comparison: Chest x-ray 04/17/2021.

CLINICAL DATA: Large hiatal hernia for 1 month. Pre-surgery workup.
History of basal cell carcinoma.

EXAM:
CT CHEST WITHOUT CONTRAST
TECHNIQUE: Multidetector CT imaging of the chest was performed following the
standard protocol without IV contrast.

[Series 2: chest w/(date) · axial · 0.74mm/px · z∈[+242,+508]mm · 15 of 157 slices shown, 19 images]
[im 12/157  mediastinal]
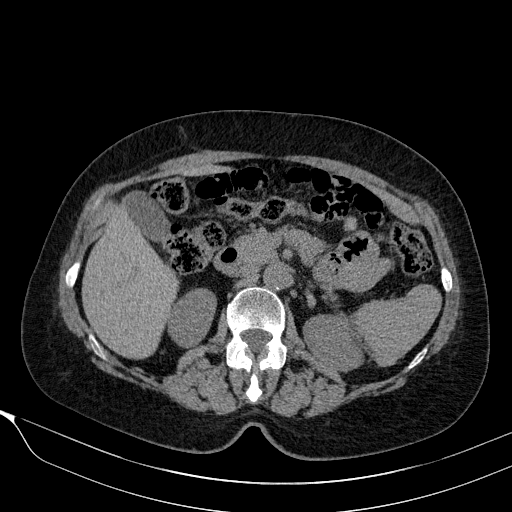
[im 12/157  lung]
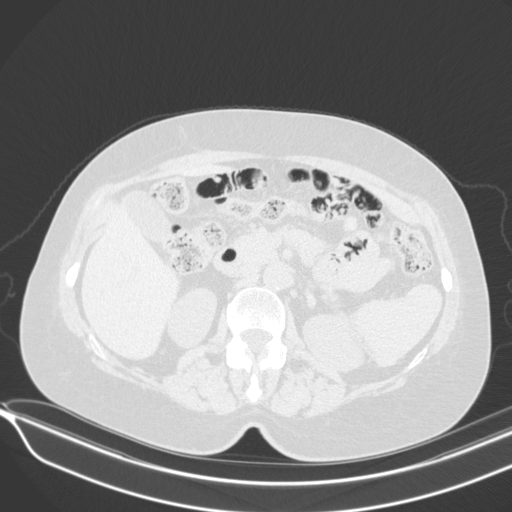
[im 24/157  lung]
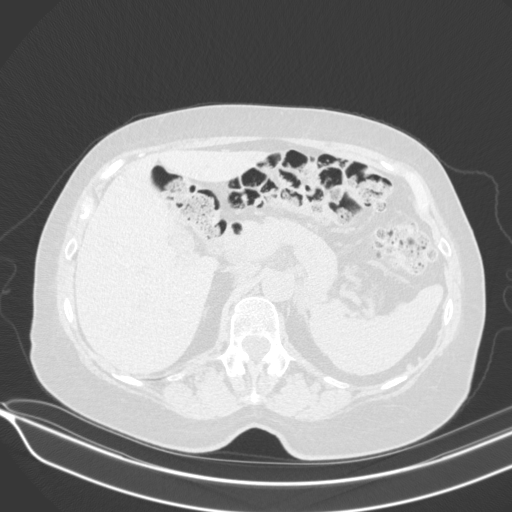
[im 32/157  lung]
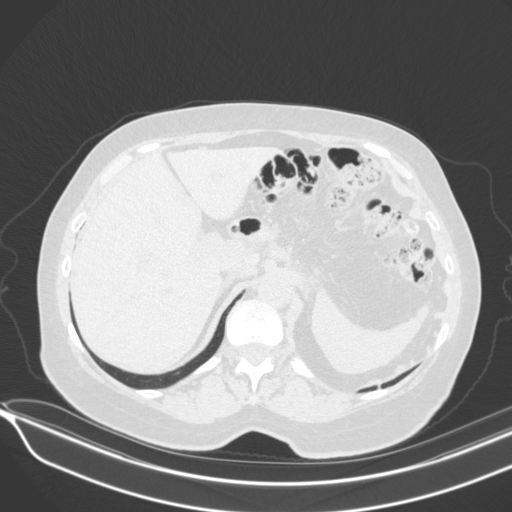
[im 41/157  lung]
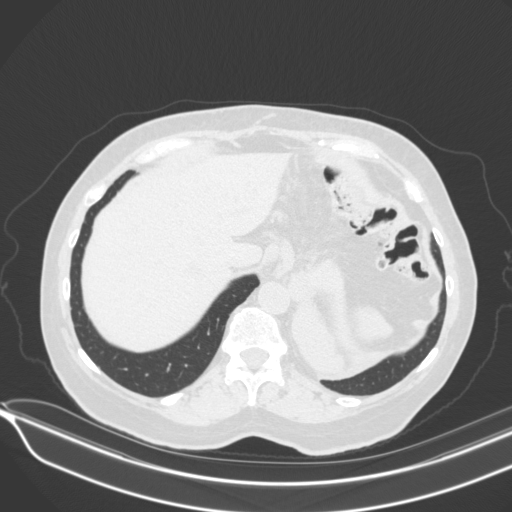
[im 53/157  mediastinal]
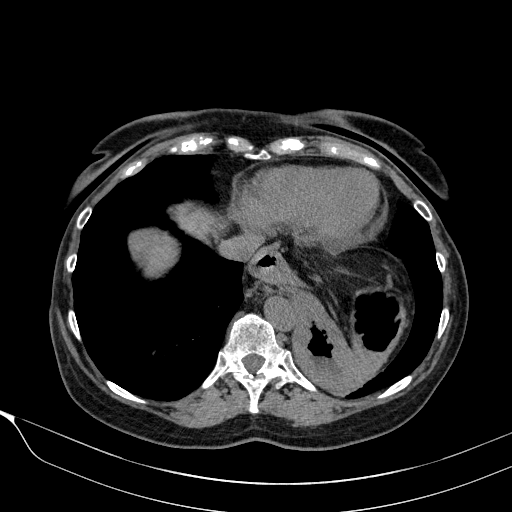
[im 53/157  lung]
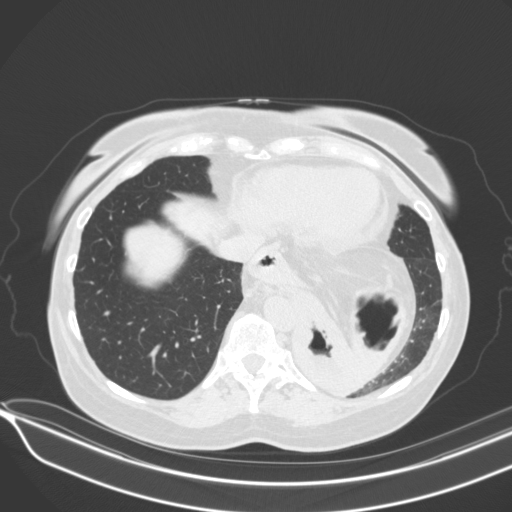
[im 63/157  lung]
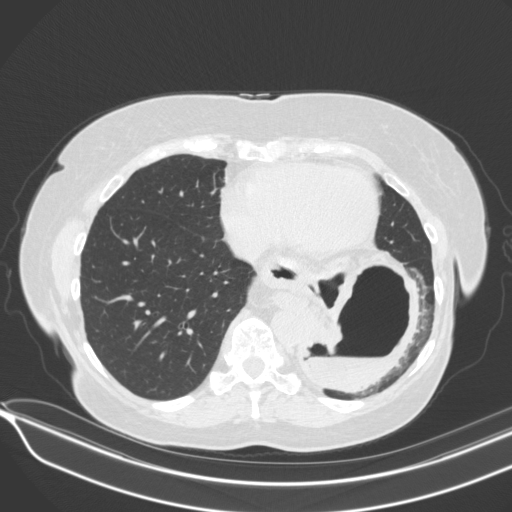
[im 70/157  lung]
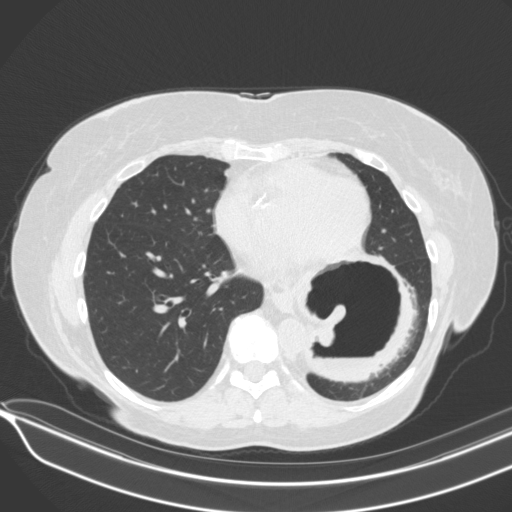
[im 81/157  lung]
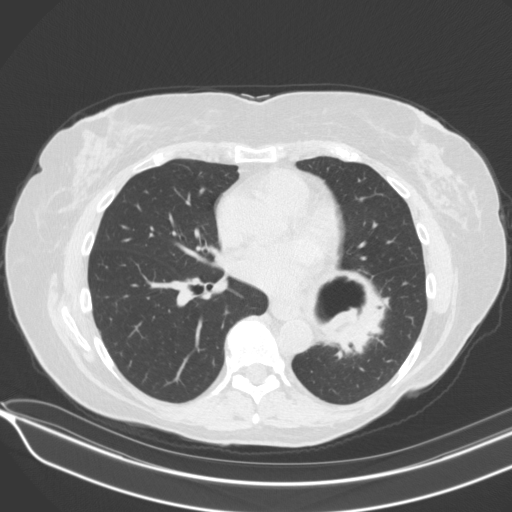
[im 87/157  mediastinal]
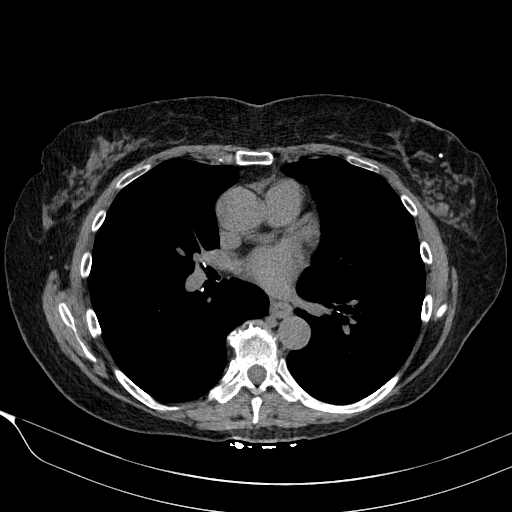
[im 87/157  lung]
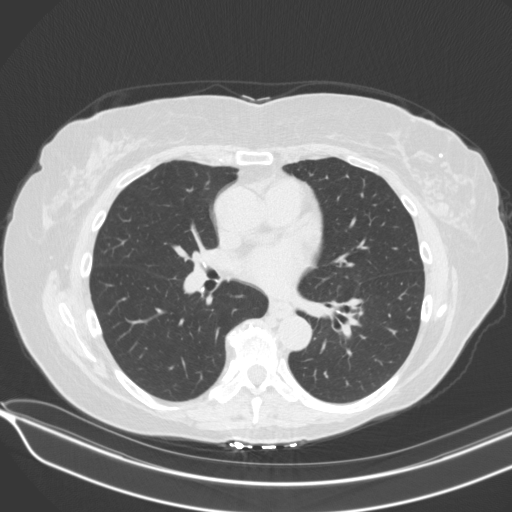
[im 94/157  lung]
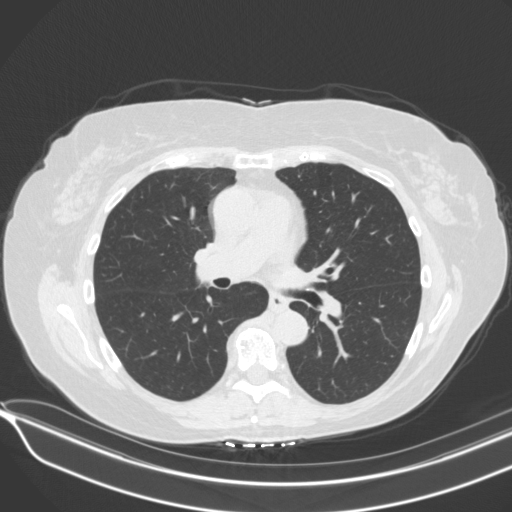
[im 105/157  lung]
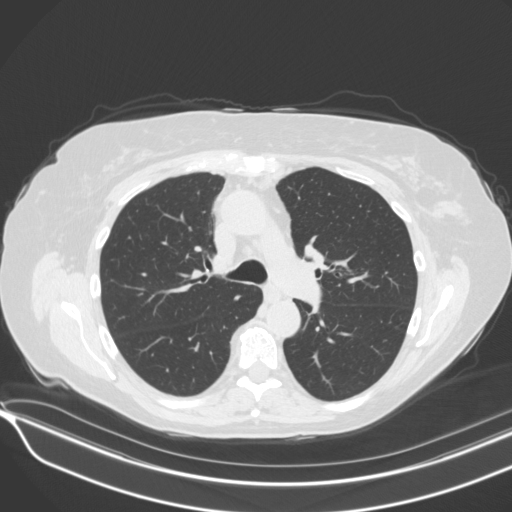
[im 116/157  lung]
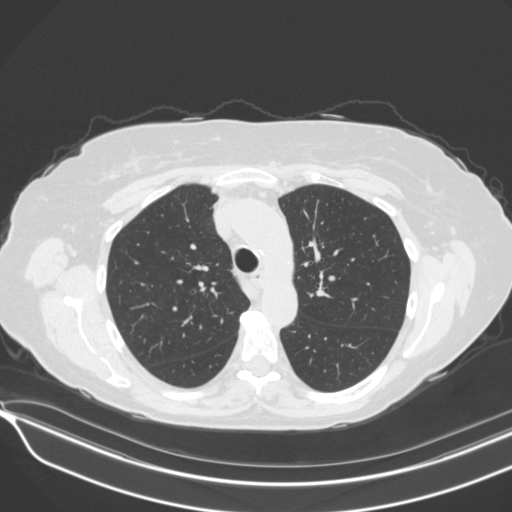
[im 125/157  mediastinal]
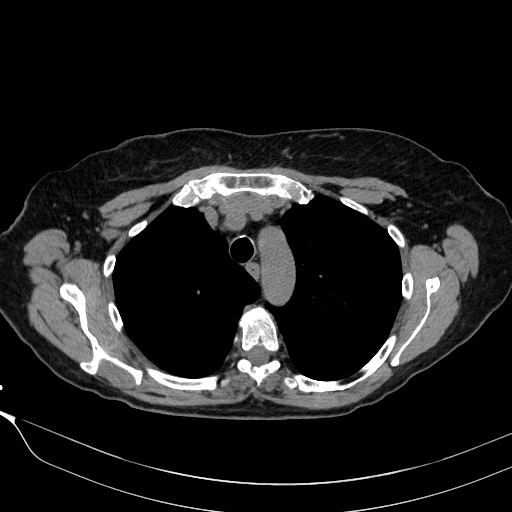
[im 125/157  lung]
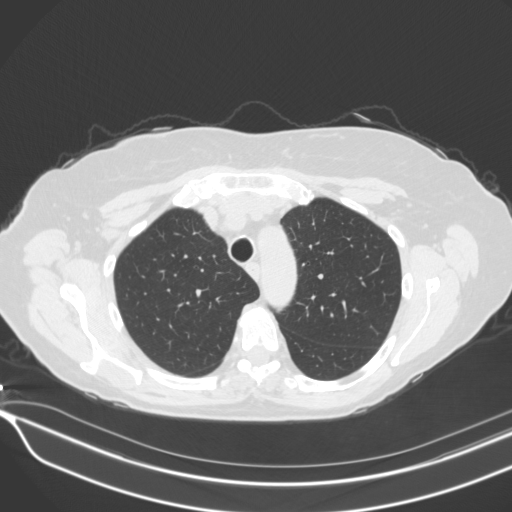
[im 133/157  lung]
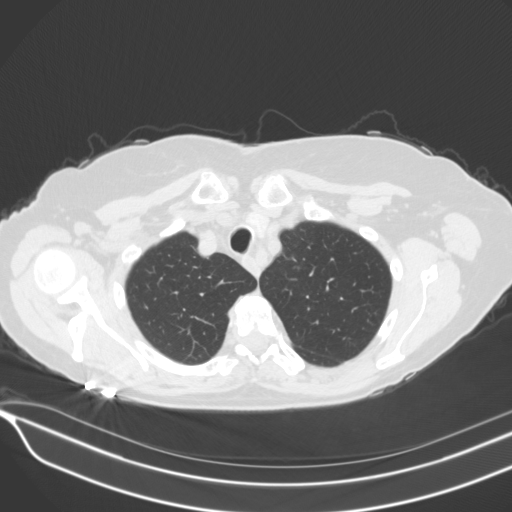
[im 145/157  lung]
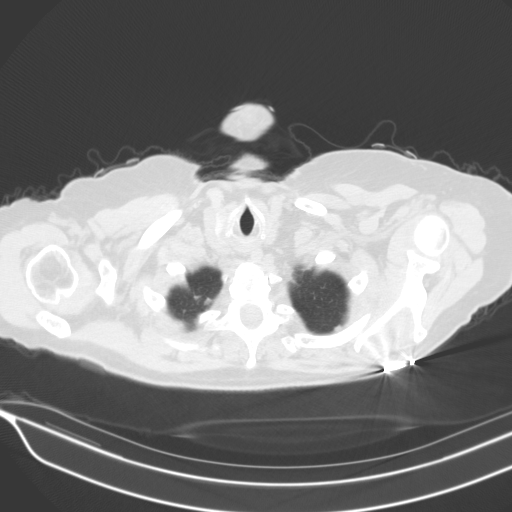

[15 of 34 positions shown; findings below may reference images not displayed]

FINDINGS: Cardiovascular: The heart and aorta are normal in size. There is a
small pericardial effusion.

Mediastinum/Nodes: There are bilateral hypodense thyroid nodules.
The largest nodule measures 1.6 cm. Esophagus is within normal
limits. There are no enlarged mediastinal or hilar lymph nodes
identified. There is a large hiatal hernia with intrathoracic
stomach.

Lungs/Pleura: There is some pleuroparenchymal scarring in the lung
apices. There is a 2 mm nodule in the left upper lobe image 3/41.
There is minimal compressive atelectasis in the left lower lobe
secondary to large hiatal hernia. Lungs are otherwise clear. There
is no pleural effusion or pneumothorax. With right

Upper Abdomen: No acute abnormality. There is a small cyst in the
left kidney. Colonic diverticulosis is present.

Musculoskeletal: No chest wall mass or suspicious bone lesions
identified.
IMPRESSION: 1. Large hiatal hernia with intrathoracic stomach.
2. Small pericardial effusion.
3. 1.6 cm incidental thyroid nodule. Recommend thyroid US.
Reference: [HOSPITAL]. [DATE]): 143-50
4. 2 mm left upper lobe nodule. No follow-up needed if patient is
low-risk. Non-contrast chest CT can be considered in 12 months if
patient is high-risk. This recommendation follows the consensus
statement: Guidelines for Management of Incidental Pulmonary Nodules
Detected on CT Images: From the [HOSPITAL] 4792; Radiology

## 2023-07-30 DIAGNOSIS — H26492 Other secondary cataract, left eye: Secondary | ICD-10-CM | POA: Diagnosis not present

## 2023-07-30 DIAGNOSIS — H52223 Regular astigmatism, bilateral: Secondary | ICD-10-CM | POA: Diagnosis not present

## 2023-07-30 DIAGNOSIS — H04123 Dry eye syndrome of bilateral lacrimal glands: Secondary | ICD-10-CM | POA: Diagnosis not present

## 2023-07-30 DIAGNOSIS — H524 Presbyopia: Secondary | ICD-10-CM | POA: Diagnosis not present

## 2023-07-30 DIAGNOSIS — H353132 Nonexudative age-related macular degeneration, bilateral, intermediate dry stage: Secondary | ICD-10-CM | POA: Diagnosis not present

## 2023-07-30 DIAGNOSIS — R7303 Prediabetes: Secondary | ICD-10-CM | POA: Diagnosis not present

## 2023-07-30 DIAGNOSIS — Z961 Presence of intraocular lens: Secondary | ICD-10-CM | POA: Diagnosis not present

## 2023-10-15 ENCOUNTER — Ambulatory Visit (INDEPENDENT_AMBULATORY_CARE_PROVIDER_SITE_OTHER): Payer: Medicare Other | Admitting: Otolaryngology

## 2023-10-15 VITALS — BP 138/81 | HR 69 | Ht 61.0 in | Wt 164.0 lb

## 2023-10-15 DIAGNOSIS — E042 Nontoxic multinodular goiter: Secondary | ICD-10-CM | POA: Diagnosis not present

## 2023-10-15 DIAGNOSIS — D440 Neoplasm of uncertain behavior of thyroid gland: Secondary | ICD-10-CM | POA: Insufficient documentation

## 2023-10-15 DIAGNOSIS — E041 Nontoxic single thyroid nodule: Secondary | ICD-10-CM

## 2023-10-15 NOTE — Progress Notes (Unsigned)
 Patient ID: Dorothy Stone, female   DOB: 05/04/1946, 78 y.o.   MRN: [id number]  Follow-up: Multinodular thyroid goiter  HPI: The patient is a 78 year old female who returns today for her follow-up evaluation.  The patient was previously seen for her multinodular thyroid goiter.  Her thyroid goiter was found incidentally on her chest CT scan.  Her neck ultrasound in 2023 showed numerous solid nodules in her thyroid gland.  Four individual nodules were noted to be greater than 1 cm.  However, none were larger than 1.2 cm.  They met the criteria for continuing image surveillance.  No biopsy was recommended.  Her follow-up ultrasound in March 2024 showed the nodules to be stable.  Annual ultrasound surveillance was recommended.  The patient returns today reporting no significant difficulty with her thyroid nodules. She denies any dysphagia, odynophagia, dyspnea, or hoarseness.    Exam: General: Communicates without difficulty, well nourished, no acute distress. Head: Normocephalic, no evidence injury, no tenderness, facial buttresses intact without stepoff. Face/sinus: No tenderness to palpation and percussion. Facial movement is normal and symmetric. Eyes: PERRL, EOMI. No scleral icterus, conjunctivae clear. Neuro: CN II exam reveals vision grossly intact.  No nystagmus at any point of gaze. Ears: Auricles well formed without lesions.  Ear canals are intact without mass or lesion.  No erythema or edema is appreciated.  The TMs are intact without fluid. Nose: External evaluation reveals normal support and skin without lesions.  Dorsum is intact.  Anterior rhinoscopy reveals pink mucosa over anterior aspect of inferior turbinates and intact septum.  No purulence noted. Oral:  Oral cavity and oropharynx are intact, symmetric, without erythema or edema.  Mucosa is moist without lesions. Neck: Full range of motion without pain.  There is no significant lymphadenopathy.  No masses palpable.  Thyroid bed is enlarged to  palpation.  Parotid glands and submandibular glands equal bilaterally without mass.  Trachea is midline.   Assessment: 1.  Asymptomatic multinodular thyroid goiter.  The patient has numerous small thyroid nodules in her thyroid gland.  Her previous thyroid ultrasound showed the nodules to be stable. 2.  Currently the patient has no dysphagia, odynophagia, or dysphonia.  Plan: 1.  The physical exam findings are reviewed with the patient. 2.  Repeat ultrasound. 3.  The patient will return for re-evaluation in 1 year, sooner if needed.

## 2023-11-12 DIAGNOSIS — E785 Hyperlipidemia, unspecified: Secondary | ICD-10-CM | POA: Diagnosis not present

## 2023-11-12 DIAGNOSIS — M810 Age-related osteoporosis without current pathological fracture: Secondary | ICD-10-CM | POA: Diagnosis not present

## 2023-11-12 DIAGNOSIS — Z79899 Other long term (current) drug therapy: Secondary | ICD-10-CM | POA: Diagnosis not present

## 2023-11-12 DIAGNOSIS — Z0000 Encounter for general adult medical examination without abnormal findings: Secondary | ICD-10-CM | POA: Diagnosis not present

## 2023-11-12 DIAGNOSIS — E041 Nontoxic single thyroid nodule: Secondary | ICD-10-CM | POA: Diagnosis not present

## 2023-11-12 DIAGNOSIS — R7309 Other abnormal glucose: Secondary | ICD-10-CM | POA: Diagnosis not present

## 2023-11-12 DIAGNOSIS — Z2821 Immunization not carried out because of patient refusal: Secondary | ICD-10-CM | POA: Diagnosis not present

## 2024-01-01 DIAGNOSIS — H353132 Nonexudative age-related macular degeneration, bilateral, intermediate dry stage: Secondary | ICD-10-CM | POA: Diagnosis not present

## 2024-01-01 DIAGNOSIS — H52223 Regular astigmatism, bilateral: Secondary | ICD-10-CM | POA: Diagnosis not present

## 2024-01-01 DIAGNOSIS — H26492 Other secondary cataract, left eye: Secondary | ICD-10-CM | POA: Diagnosis not present

## 2024-01-01 DIAGNOSIS — R7303 Prediabetes: Secondary | ICD-10-CM | POA: Diagnosis not present

## 2024-01-01 DIAGNOSIS — H524 Presbyopia: Secondary | ICD-10-CM | POA: Diagnosis not present

## 2024-01-01 DIAGNOSIS — H04123 Dry eye syndrome of bilateral lacrimal glands: Secondary | ICD-10-CM | POA: Diagnosis not present

## 2024-03-18 DIAGNOSIS — M25562 Pain in left knee: Secondary | ICD-10-CM | POA: Diagnosis not present

## 2024-03-18 DIAGNOSIS — R252 Cramp and spasm: Secondary | ICD-10-CM | POA: Diagnosis not present

## 2024-03-18 DIAGNOSIS — M25571 Pain in right ankle and joints of right foot: Secondary | ICD-10-CM | POA: Diagnosis not present

## 2024-03-18 DIAGNOSIS — Z7689 Persons encountering health services in other specified circumstances: Secondary | ICD-10-CM | POA: Diagnosis not present

## 2024-03-18 DIAGNOSIS — G8929 Other chronic pain: Secondary | ICD-10-CM | POA: Diagnosis not present

## 2024-03-18 DIAGNOSIS — M25572 Pain in left ankle and joints of left foot: Secondary | ICD-10-CM | POA: Diagnosis not present

## 2024-03-18 DIAGNOSIS — Z13220 Encounter for screening for lipoid disorders: Secondary | ICD-10-CM | POA: Diagnosis not present

## 2024-03-18 DIAGNOSIS — M25561 Pain in right knee: Secondary | ICD-10-CM | POA: Diagnosis not present

## 2024-03-18 DIAGNOSIS — Z1159 Encounter for screening for other viral diseases: Secondary | ICD-10-CM | POA: Diagnosis not present

## 2024-03-18 DIAGNOSIS — E049 Nontoxic goiter, unspecified: Secondary | ICD-10-CM | POA: Diagnosis not present

## 2024-03-30 DIAGNOSIS — M25571 Pain in right ankle and joints of right foot: Secondary | ICD-10-CM | POA: Diagnosis not present

## 2024-03-30 DIAGNOSIS — M25572 Pain in left ankle and joints of left foot: Secondary | ICD-10-CM | POA: Diagnosis not present

## 2024-03-30 DIAGNOSIS — M25561 Pain in right knee: Secondary | ICD-10-CM | POA: Diagnosis not present

## 2024-03-30 DIAGNOSIS — M25562 Pain in left knee: Secondary | ICD-10-CM | POA: Diagnosis not present

## 2024-04-22 DIAGNOSIS — M7989 Other specified soft tissue disorders: Secondary | ICD-10-CM | POA: Diagnosis not present

## 2024-04-22 DIAGNOSIS — M79661 Pain in right lower leg: Secondary | ICD-10-CM | POA: Diagnosis not present

## 2024-04-22 DIAGNOSIS — M19071 Primary osteoarthritis, right ankle and foot: Secondary | ICD-10-CM | POA: Diagnosis not present

## 2024-04-22 DIAGNOSIS — M76821 Posterior tibial tendinitis, right leg: Secondary | ICD-10-CM | POA: Diagnosis not present

## 2024-04-23 DIAGNOSIS — I82411 Acute embolism and thrombosis of right femoral vein: Secondary | ICD-10-CM | POA: Diagnosis not present

## 2024-04-23 DIAGNOSIS — M79661 Pain in right lower leg: Secondary | ICD-10-CM | POA: Diagnosis not present
